# Patient Record
Sex: Male | Born: 1945 | Race: Black or African American | Hispanic: No | Marital: Married | Smoking: Current every day smoker
Health system: Southern US, Community
[De-identification: ages and names within clinical notes are randomized; demographics above are authoritative.]

## PROBLEM LIST (undated history)

## (undated) DIAGNOSIS — E119 Type 2 diabetes mellitus without complications: Secondary | ICD-10-CM

## (undated) DIAGNOSIS — I10 Essential (primary) hypertension: Secondary | ICD-10-CM

## (undated) DIAGNOSIS — L409 Psoriasis, unspecified: Secondary | ICD-10-CM

## (undated) HISTORY — PX: BACK SURGERY: SHX140

---

## 1997-12-12 ENCOUNTER — Emergency Department (HOSPITAL_COMMUNITY): Admission: EM | Admit: 1997-12-12 | Discharge: 1997-12-12 | Payer: Self-pay | Admitting: Emergency Medicine

## 2001-03-23 ENCOUNTER — Emergency Department (HOSPITAL_COMMUNITY): Admission: EM | Admit: 2001-03-23 | Discharge: 2001-03-23 | Payer: Self-pay | Admitting: Emergency Medicine

## 2001-03-23 ENCOUNTER — Encounter: Payer: Self-pay | Admitting: Emergency Medicine

## 2001-04-26 ENCOUNTER — Emergency Department (HOSPITAL_COMMUNITY): Admission: EM | Admit: 2001-04-26 | Discharge: 2001-04-26 | Payer: Self-pay | Admitting: Emergency Medicine

## 2001-05-13 ENCOUNTER — Emergency Department (HOSPITAL_COMMUNITY): Admission: EM | Admit: 2001-05-13 | Discharge: 2001-05-13 | Payer: Self-pay | Admitting: Emergency Medicine

## 2004-11-24 ENCOUNTER — Emergency Department (HOSPITAL_COMMUNITY): Admission: EM | Admit: 2004-11-24 | Discharge: 2004-11-24 | Payer: Self-pay | Admitting: Emergency Medicine

## 2004-12-15 ENCOUNTER — Emergency Department (HOSPITAL_COMMUNITY): Admission: EM | Admit: 2004-12-15 | Discharge: 2004-12-15 | Payer: Self-pay | Admitting: Emergency Medicine

## 2004-12-24 ENCOUNTER — Encounter: Admission: RE | Admit: 2004-12-24 | Discharge: 2004-12-24 | Payer: Self-pay | Admitting: Emergency Medicine

## 2005-04-29 ENCOUNTER — Inpatient Hospital Stay (HOSPITAL_COMMUNITY): Admission: AD | Admit: 2005-04-29 | Discharge: 2005-05-01 | Payer: Self-pay | Admitting: *Deleted

## 2005-06-15 ENCOUNTER — Ambulatory Visit: Payer: Self-pay | Admitting: Internal Medicine

## 2006-05-31 ENCOUNTER — Ambulatory Visit: Payer: Self-pay | Admitting: Internal Medicine

## 2006-08-24 ENCOUNTER — Encounter: Admission: RE | Admit: 2006-08-24 | Discharge: 2006-08-24 | Payer: Self-pay | Admitting: Urology

## 2006-08-26 ENCOUNTER — Ambulatory Visit (HOSPITAL_BASED_OUTPATIENT_CLINIC_OR_DEPARTMENT_OTHER): Admission: RE | Admit: 2006-08-26 | Discharge: 2006-08-26 | Payer: Self-pay | Admitting: Urology

## 2007-06-06 ENCOUNTER — Ambulatory Visit: Payer: Self-pay | Admitting: Internal Medicine

## 2007-06-06 DIAGNOSIS — M19079 Primary osteoarthritis, unspecified ankle and foot: Secondary | ICD-10-CM | POA: Insufficient documentation

## 2007-06-06 DIAGNOSIS — I10 Essential (primary) hypertension: Secondary | ICD-10-CM | POA: Insufficient documentation

## 2007-06-06 DIAGNOSIS — E119 Type 2 diabetes mellitus without complications: Secondary | ICD-10-CM

## 2007-06-06 DIAGNOSIS — E785 Hyperlipidemia, unspecified: Secondary | ICD-10-CM | POA: Insufficient documentation

## 2007-06-06 LAB — CONVERTED CEMR LAB
BUN: 9 mg/dL (ref 6–23)
Basophils Absolute: 0.3 10*3/uL — ABNORMAL HIGH (ref 0.0–0.1)
Basophils Relative: 4.3 % — ABNORMAL HIGH (ref 0.0–1.0)
Bilirubin Urine: NEGATIVE
CO2: 24 meq/L (ref 19–32)
Chloride: 100 meq/L (ref 96–112)
Creatinine, Ser: 0.8 mg/dL (ref 0.4–1.5)
Eosinophils Relative: 0.6 % (ref 0.0–5.0)
Glucose, Bld: 128 mg/dL — ABNORMAL HIGH (ref 70–99)
HCT: 43 % (ref 39.0–52.0)
Hemoglobin: 14.7 g/dL (ref 13.0–17.0)
Ketones, ur: NEGATIVE mg/dL
Lymphocytes Relative: 41.4 % (ref 12.0–46.0)
MCV: 91.1 fL (ref 78.0–100.0)
Monocytes Absolute: 0.3 10*3/uL (ref 0.2–0.7)
Monocytes Relative: 4.3 % (ref 3.0–11.0)
Neutro Abs: 3.3 10*3/uL (ref 1.4–7.7)
Nitrite: NEGATIVE
Platelets: 305 10*3/uL (ref 150–400)
RBC: 4.72 M/uL (ref 4.22–5.81)
Sodium: 135 meq/L (ref 135–145)
Specific Gravity, Urine: 1.015 (ref 1.000–1.03)
Total Bilirubin: 0.7 mg/dL (ref 0.3–1.2)
Total CHOL/HDL Ratio: 3
Triglycerides: 96 mg/dL (ref 0–149)
Urine Glucose: NEGATIVE mg/dL
VLDL: 19 mg/dL (ref 0–40)
WBC: 6.7 10*3/uL (ref 4.5–10.5)
pH: 6 (ref 5.0–8.0)

## 2008-05-18 ENCOUNTER — Ambulatory Visit: Payer: Self-pay | Admitting: Internal Medicine

## 2008-05-18 DIAGNOSIS — M79609 Pain in unspecified limb: Secondary | ICD-10-CM | POA: Insufficient documentation

## 2008-05-18 DIAGNOSIS — M25569 Pain in unspecified knee: Secondary | ICD-10-CM | POA: Insufficient documentation

## 2008-05-22 ENCOUNTER — Encounter (INDEPENDENT_AMBULATORY_CARE_PROVIDER_SITE_OTHER): Payer: Self-pay | Admitting: *Deleted

## 2008-09-06 ENCOUNTER — Encounter: Payer: Self-pay | Admitting: Internal Medicine

## 2009-05-27 ENCOUNTER — Ambulatory Visit: Payer: Self-pay | Admitting: Internal Medicine

## 2009-05-27 LAB — CONVERTED CEMR LAB
AST: 19 units/L (ref 0–37)
Albumin: 4 g/dL (ref 3.5–5.2)
Alkaline Phosphatase: 66 units/L (ref 39–117)
BUN: 8 mg/dL (ref 6–23)
Basophils Relative: 0.6 % (ref 0.0–3.0)
Bilirubin, Direct: 0.1 mg/dL (ref 0.0–0.3)
CO2: 27 meq/L (ref 19–32)
Chloride: 102 meq/L (ref 96–112)
Direct LDL: 122.7 mg/dL
Eosinophils Relative: 0.5 % (ref 0.0–5.0)
HCT: 42.2 % (ref 39.0–52.0)
HDL: 52.5 mg/dL (ref >39.00)
Hgb A1c MFr Bld: 6.3 % (ref 4.6–6.5)
Lymphocytes Relative: 32.7 % (ref 12.0–46.0)
MCHC: 34.6 g/dL (ref 30.0–36.0)
Neutro Abs: 3 10*3/uL (ref 1.4–7.7)
Neutrophils Relative %: 61.7 % (ref 43.0–77.0)
PSA: 1.26 ng/mL (ref 0.10–4.00)
RBC: 4.4 M/uL (ref 4.22–5.81)
RDW: 13.2 % (ref 11.5–14.6)
TSH: 0.78 microintl units/mL (ref 0.35–5.50)
Total Protein, Urine: NEGATIVE mg/dL
Total Protein: 8.1 g/dL (ref 6.0–8.3)
Triglycerides: 229 mg/dL — ABNORMAL HIGH (ref 0.0–149.0)
Urine Glucose: NEGATIVE mg/dL
WBC: 4.7 10*3/uL (ref 4.5–10.5)
pH: 5 (ref 5.0–8.0)

## 2009-06-20 ENCOUNTER — Encounter (INDEPENDENT_AMBULATORY_CARE_PROVIDER_SITE_OTHER): Payer: Self-pay | Admitting: *Deleted

## 2009-07-30 ENCOUNTER — Encounter (INDEPENDENT_AMBULATORY_CARE_PROVIDER_SITE_OTHER): Payer: Self-pay

## 2009-09-02 ENCOUNTER — Encounter: Payer: Self-pay | Admitting: Internal Medicine

## 2009-11-19 ENCOUNTER — Ambulatory Visit: Payer: Self-pay | Admitting: Internal Medicine

## 2009-11-19 DIAGNOSIS — G4710 Hypersomnia, unspecified: Secondary | ICD-10-CM | POA: Insufficient documentation

## 2009-11-19 DIAGNOSIS — G4700 Insomnia, unspecified: Secondary | ICD-10-CM | POA: Insufficient documentation

## 2009-11-19 DIAGNOSIS — R609 Edema, unspecified: Secondary | ICD-10-CM | POA: Insufficient documentation

## 2009-11-19 LAB — CONVERTED CEMR LAB
BUN: 12 mg/dL (ref 6–23)
CO2: 26 meq/L (ref 19–32)
Chloride: 105 meq/L (ref 96–112)
Cholesterol: 201 mg/dL — ABNORMAL HIGH (ref 0–200)
GFR calc non Af Amer: 126.22 mL/min (ref >60)
HDL: 56.9 mg/dL (ref >39.00)
Hgb A1c MFr Bld: 5.9 % (ref 4.6–6.5)
Potassium: 3.8 meq/L (ref 3.5–5.1)
Total CHOL/HDL Ratio: 4
Triglycerides: 166 mg/dL — ABNORMAL HIGH (ref 0.0–149.0)
VLDL: 33.2 mg/dL (ref 0.0–40.0)

## 2009-11-26 ENCOUNTER — Encounter: Payer: Self-pay | Admitting: Internal Medicine

## 2009-11-26 ENCOUNTER — Ambulatory Visit (HOSPITAL_BASED_OUTPATIENT_CLINIC_OR_DEPARTMENT_OTHER): Admission: RE | Admit: 2009-11-26 | Discharge: 2009-11-26 | Payer: Self-pay | Admitting: Internal Medicine

## 2009-11-29 ENCOUNTER — Telehealth: Payer: Self-pay | Admitting: Internal Medicine

## 2009-12-01 ENCOUNTER — Ambulatory Visit: Payer: Self-pay | Admitting: Pulmonary Disease

## 2009-12-06 ENCOUNTER — Telehealth: Payer: Self-pay | Admitting: Internal Medicine

## 2009-12-06 DIAGNOSIS — G4733 Obstructive sleep apnea (adult) (pediatric): Secondary | ICD-10-CM | POA: Insufficient documentation

## 2009-12-26 ENCOUNTER — Ambulatory Visit: Payer: Self-pay | Admitting: Pulmonary Disease

## 2010-01-13 ENCOUNTER — Telehealth (INDEPENDENT_AMBULATORY_CARE_PROVIDER_SITE_OTHER): Payer: Self-pay | Admitting: *Deleted

## 2010-01-22 ENCOUNTER — Encounter: Payer: Self-pay | Admitting: Pulmonary Disease

## 2010-01-24 ENCOUNTER — Ambulatory Visit: Payer: Self-pay | Admitting: Pulmonary Disease

## 2010-01-28 ENCOUNTER — Telehealth (INDEPENDENT_AMBULATORY_CARE_PROVIDER_SITE_OTHER): Payer: Self-pay | Admitting: *Deleted

## 2010-02-25 ENCOUNTER — Telehealth (INDEPENDENT_AMBULATORY_CARE_PROVIDER_SITE_OTHER): Payer: Self-pay | Admitting: *Deleted

## 2010-04-10 ENCOUNTER — Encounter: Admission: RE | Admit: 2010-04-10 | Discharge: 2010-04-10 | Payer: Self-pay | Admitting: Orthopedic Surgery

## 2010-05-20 ENCOUNTER — Ambulatory Visit: Payer: Self-pay | Admitting: Internal Medicine

## 2010-06-16 ENCOUNTER — Emergency Department (HOSPITAL_COMMUNITY): Admission: EM | Admit: 2010-06-16 | Discharge: 2010-06-16 | Payer: Self-pay | Admitting: Emergency Medicine

## 2010-06-26 ENCOUNTER — Ambulatory Visit: Payer: Self-pay | Admitting: Internal Medicine

## 2010-06-26 DIAGNOSIS — M5136 Other intervertebral disc degeneration, lumbar region: Secondary | ICD-10-CM

## 2010-06-27 LAB — CONVERTED CEMR LAB
Chloride: 104 meq/L (ref 96–112)
Creatinine, Ser: 0.5 mg/dL (ref 0.4–1.5)
GFR calc non Af Amer: 221.79 mL/min (ref >60.00)
HDL: 60.1 mg/dL (ref >39.00)
PSA: 1.06 ng/mL (ref 0.10–4.00)
Potassium: 3.5 meq/L (ref 3.5–5.1)
Triglycerides: 109 mg/dL (ref 0.0–149.0)

## 2010-07-09 ENCOUNTER — Telehealth (INDEPENDENT_AMBULATORY_CARE_PROVIDER_SITE_OTHER): Payer: Self-pay | Admitting: *Deleted

## 2010-07-10 ENCOUNTER — Telehealth (INDEPENDENT_AMBULATORY_CARE_PROVIDER_SITE_OTHER): Payer: Self-pay | Admitting: Radiology

## 2010-07-10 ENCOUNTER — Encounter (HOSPITAL_COMMUNITY): Admission: RE | Admit: 2010-07-10 | Payer: Self-pay | Source: Home / Self Care | Admitting: Internal Medicine

## 2010-07-10 ENCOUNTER — Ambulatory Visit: Payer: Self-pay

## 2010-07-10 ENCOUNTER — Encounter: Payer: Self-pay | Admitting: Internal Medicine

## 2010-07-10 ENCOUNTER — Inpatient Hospital Stay (HOSPITAL_COMMUNITY)
Admission: EM | Admit: 2010-07-10 | Discharge: 2010-07-13 | Payer: Self-pay | Source: Home / Self Care | Attending: Internal Medicine | Admitting: Internal Medicine

## 2010-07-15 ENCOUNTER — Telehealth: Payer: Self-pay | Admitting: Internal Medicine

## 2010-07-17 ENCOUNTER — Encounter: Payer: Self-pay | Admitting: Internal Medicine

## 2010-07-25 ENCOUNTER — Encounter: Payer: Self-pay | Admitting: Internal Medicine

## 2010-07-28 ENCOUNTER — Ambulatory Visit: Admit: 2010-07-28 | Payer: Self-pay | Admitting: Internal Medicine

## 2010-08-05 ENCOUNTER — Encounter (HOSPITAL_COMMUNITY): Admission: RE | Admit: 2010-08-05 | Payer: Self-pay | Source: Home / Self Care | Admitting: Internal Medicine

## 2010-08-11 ENCOUNTER — Telehealth (INDEPENDENT_AMBULATORY_CARE_PROVIDER_SITE_OTHER): Payer: Self-pay

## 2010-08-12 ENCOUNTER — Encounter: Payer: Self-pay | Admitting: Cardiology

## 2010-08-12 ENCOUNTER — Ambulatory Visit: Admission: RE | Admit: 2010-08-12 | Discharge: 2010-08-12 | Payer: Self-pay | Source: Home / Self Care

## 2010-08-12 ENCOUNTER — Encounter: Payer: Self-pay | Admitting: *Deleted

## 2010-08-12 ENCOUNTER — Encounter (HOSPITAL_COMMUNITY)
Admission: RE | Admit: 2010-08-12 | Discharge: 2010-08-19 | Payer: Self-pay | Source: Home / Self Care | Attending: Internal Medicine | Admitting: Internal Medicine

## 2010-08-13 ENCOUNTER — Telehealth (INDEPENDENT_AMBULATORY_CARE_PROVIDER_SITE_OTHER): Payer: Self-pay | Admitting: *Deleted

## 2010-08-13 ENCOUNTER — Ambulatory Visit: Admission: RE | Admit: 2010-08-13 | Discharge: 2010-08-13 | Payer: Self-pay | Source: Home / Self Care

## 2010-08-17 LAB — CONVERTED CEMR LAB
ALT: 18 units/L (ref 0–53)
Alkaline Phosphatase: 65 units/L (ref 39–117)
Basophils Relative: 0.7 % (ref 0.0–3.0)
Bilirubin Urine: NEGATIVE
CO2: 28 meq/L (ref 19–32)
Creatinine, Ser: 0.7 mg/dL (ref 0.4–1.5)
Eosinophils Absolute: 0.1 10*3/uL (ref 0.0–0.7)
Eosinophils Relative: 0.9 % (ref 0.0–5.0)
Glucose, Bld: 128 mg/dL — ABNORMAL HIGH (ref 70–99)
HCT: 39.2 % (ref 39.0–52.0)
HDL: 56.1 mg/dL (ref >39.0)
Hemoglobin, Urine: NEGATIVE
Hgb A1c MFr Bld: 7.5 % — ABNORMAL HIGH (ref 4.6–6.0)
Ketones, ur: NEGATIVE mg/dL
Lymphocytes Relative: 32.5 % (ref 12.0–46.0)
MCHC: 34.8 g/dL (ref 30.0–36.0)
MCV: 91.8 fL (ref 78.0–100.0)
Microalb Creat Ratio: 5.6 mg/g (ref 0.0–30.0)
Microalb, Ur: 0.4 mg/dL (ref 0.0–1.9)
Monocytes Relative: 8.1 % (ref 3.0–12.0)
Neutro Abs: 3.7 10*3/uL (ref 1.4–7.7)
Neutrophils Relative %: 57.8 % (ref 43.0–77.0)
Nitrite: NEGATIVE
Potassium: 4.1 meq/L (ref 3.5–5.1)
RBC: 4.27 M/uL (ref 4.22–5.81)
RDW: 13.7 % (ref 11.5–14.6)
Sodium: 140 meq/L (ref 135–145)
Specific Gravity, Urine: 1.015 (ref 1.000–1.03)
TSH: 0.77 microintl units/mL (ref 0.35–5.50)
Total Bilirubin: 0.7 mg/dL (ref 0.3–1.2)
Total Protein, Urine: NEGATIVE mg/dL
Urobilinogen, UA: 0.2 (ref 0.0–1.0)
WBC: 6.3 10*3/uL (ref 4.5–10.5)

## 2010-08-19 NOTE — Progress Notes (Signed)
  Phone Note Other Incoming   Request: Send information Action Taken: Software engineer of Call: Request for records received from [employer]. Request forwarded to [facility].

## 2010-08-19 NOTE — Assessment & Plan Note (Signed)
Summary: PER ROBIN--SURGERY CLEARANCE---STC   Vital Signs:  Patient profile:   65 year old male Height:      71 inches Weight:      283 pounds BMI:     39.61 O2 Sat:      98 % on Room air Temp:     98.6 degrees F oral Pulse rate:   94 / minute BP sitting:   132 / 82  (left arm) Cuff size:   large  Vitals Entered By: Zella Ball Ewing CMA Duncan Dull) (June 26, 2010 3:00 PM)  O2 Flow:  Room air  CC: Surgical Clearance/RE   Primary Care Provider:  Corwin Levins MD  CC:  Surgical Clearance/RE.  History of Present Illness: here with wife who is CNA doing private duty for another client; not working since nov 4;  Pt denies CP, worsening sob, doe, wheezing, orthopnea, pnd, worsening LE edema, palps, dizziness or syncope  Pt denies new neuro symptoms such as headache, facial or extremity weakness  Pt denies polydipsia, polyuria, or low sugar symptoms such as shakiness improved with eating.  Overall good compliance with meds, trying to follow low chol, DM diet, wt stable, little excercise however Stopped the actos and lovastatin for unclear reasons.  Has pain to the lower back now about 8/10, chronic persistent wtihout bowel or bladder change, fever , due for surgury for 4 level lumber disc surgury.    Preventive Screening-Counseling & Management      Drug Use:  no.    Problems Prior to Update: 1)  Special Screening Malig Neoplasms Other Sites  (ICD-V76.49) 2)  Disc Disease, Lumbar  (ICD-722.52) 3)  Preoperative Examination  (ICD-V72.84) 4)  Sleep Apnea, Obstructive  (ICD-327.23) 5)  Insomnia-sleep Disorder-unspec  (ICD-780.52) 6)  Peripheral Edema  (ICD-782.3) 7)  Hypersomnia  (ICD-780.54) 8)  Leg Pain, Bilateral  (ICD-729.5) 9)  Knee Pain, Bilateral  (ICD-719.46) 10)  Preventive Health Care  (ICD-V70.0) 11)  Osteoarthrosis Unspec Whether Gen/loc Ank&foot  (ICD-715.97) 12)  Hypertension  (ICD-401.9) 13)  Hyperlipidemia  (ICD-272.4) 14)  Diabetes Mellitus, Type II  (ICD-250.00) 15)   Preventive Health Care  (ICD-V70.0) 16)  Family History Breast Cancer 1st Degree Relative <50  (ICD-V16.3) 17)  Family History of Cad Male 1st Degree Relative <50  (ICD-V17.3)  Medications Prior to Update: 1)  Actos 45 Mg Tabs (Pioglitazone Hcl) .... Take 1 Tablet By Mouth Once A Day 2)  Amlodipine Besylate 10 Mg Tabs (Amlodipine Besylate) .Marland Kitchen.. 1 Tablet By Mouth Once A Day 3)  Benazepril Hcl 20 Mg Tabs (Benazepril Hcl) .Marland Kitchen.. 1po Once Daily 4)  Glipizide 10 Mg Tb24 (Glipizide) .Marland Kitchen.. 1 Tablet By Mouth Once A Day 5)  Lovastatin 40 Mg Tabs (Lovastatin) .Marland Kitchen.. 1po Once Daily 6)  Ecotrin Low Strength 81 Mg  Tbec (Aspirin) .Marland Kitchen.. 1 By Mouth Qd 7)  Onetouch Ultrasoft Lancets   Misc (Lancets) .... Use Asd 1 Once Daily 8)  Onetouch Ultra Test   Strp (Glucose Blood) .... Use Asd 1 Once Daily 9)  Cialis 20 Mg Tabs (Tadalafil) .Marland Kitchen.. 1 By Mouth Every Other Day As Needed 10)  Diclofenac Sodium 75 Mg Tbec (Diclofenac Sodium) .Marland Kitchen.. 1 By Mouth Two Times A Day As Needed 11)  Zolpidem Tartrate 5 Mg Tabs (Zolpidem Tartrate) .Marland Kitchen.. 1 By Mouth Once Daily As Needed Sleep  Current Medications (verified): 1)  Amlodipine Besylate 10 Mg Tabs (Amlodipine Besylate) .Marland Kitchen.. 1 Tablet By Mouth Once A Day 2)  Benazepril Hcl 20 Mg Tabs (Benazepril Hcl) .Marland KitchenMarland KitchenMarland Kitchen  1po Once Daily 3)  Glipizide 10 Mg Tb24 (Glipizide) .Marland Kitchen.. 1 Tablet By Mouth Once A Day 4)  Ecotrin Low Strength 81 Mg  Tbec (Aspirin) .Marland Kitchen.. 1 By Mouth Qd 5)  Onetouch Ultrasoft Lancets   Misc (Lancets) .... Use Asd 1 Once Daily 6)  Onetouch Ultra Test   Strp (Glucose Blood) .... Use Asd 1 Once Daily 7)  Cialis 20 Mg Tabs (Tadalafil) .Marland Kitchen.. 1 By Mouth Every Other Day As Needed 8)  Diclofenac Sodium 75 Mg Tbec (Diclofenac Sodium) .Marland Kitchen.. 1 By Mouth Two Times A Day As Needed 9)  Zolpidem Tartrate 5 Mg Tabs (Zolpidem Tartrate) .Marland Kitchen.. 1 By Mouth Once Daily As Needed Sleep 10)  Oxycodone-Acetaminophen 7.5-325 Mg Tabs (Oxycodone-Acetaminophen) .Marland Kitchen.. 1 By Mouth Q 6 Hrs As Needed  Allergies  (verified): 1)  ! Celebrex 2)  ! Sulfa 3)  ! Penicillin 4)  Simvastatin  Past History:  Past Medical History: Last updated: 05/18/2008 Diabetes mellitus, type II Hyperlipidemia Hypertension right ankle djd knee djd E.D.  Past Surgical History: Last updated: 12/26/2009 march 2008 - circumcision R hand surgery  Family History: Last updated: 05/18/2008 Family History of CAD Male  - father died with MI at 86 yo Family History Breast cancer - grandmother  Social History: Last updated: 06/26/2010 Current Smoker. smokes cigars approx 2- 3 per day. 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 smokes cigars approx 2- 3 per day. 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   Head:  normocephalic and atraumatic.   Eyes:  vision grossly intact, pupils equal, and pupils round.   Ears:  R ear normal and L ear normal.   Nose:  no external deformity and no nasal discharge.   Mouth:  no gingival abnormalities and pharynx pink and moist.   Neck:  supple and no masses.   Lungs:  normal respiratory effort and normal breath sounds.   Heart:  normal rate and regular rhythm.   Abdomen:  soft, non-tender, and normal bowel sounds.   Msk:  no joint tenderness and no joint swelling.  , has diffuse lumbar spinal and paravertebral tenderness without swelling Extremities:  no edema, no erythema  Neurologic:  cranial nerves II-XII intact and strength normal in all extremities.  , sitting in wheelchair today   Impression & Recommendations:  Problem # 1:  DISC DISEASE, LUMBAR (ICD-722.52) severe, for 4 level laminectomy asap, for tx with  percocet 7.5/325 as needed for now for  better pain control  Problem # 2:  DIABETES MELLITUS, TYPE II (ICD-250.00)  The following medications were removed from the medication list:    Actos 45 Mg Tabs (Pioglitazone hcl) .Marland Kitchen... Take 1 tablet by mouth once a day His updated medication list for this problem includes:    Benazepril Hcl 20 Mg Tabs (Benazepril hcl) .Marland Kitchen... 1po once daily    Glipizide 10 Mg Tb24 (Glipizide) .Marland Kitchen... 1 tablet by mouth once a day    Ecotrin Low Strength 81 Mg Tbec (Aspirin) .Marland Kitchen... 1 by mouth qd  Labs Reviewed: Creat: 0.8 (11/19/2009)    Reviewed HgBA1c results: 5.9 (11/19/2009)  6.3 (05/27/2009)  Orders: TLB-A1C /  Hgb A1C (Glycohemoglobin) (83036-A1C) TLB-BMP (Basic Metabolic Panel-BMET) (80048-METABOL) TLB-Lipid Panel (80061-LIPID) stopped the actos  and now  ? control - for Pt to cont DM diet, excercise, wt control efforts; to check labs today   Problem # 3:  HYPERTENSION (ICD-401.9)  His updated medication list for this problem includes:    Amlodipine Besylate 10 Mg Tabs (Amlodipine besylate) .Marland Kitchen... 1 tablet by mouth once a day    Benazepril Hcl 20 Mg Tabs (Benazepril hcl) .Marland Kitchen... 1po once daily  BP today: 132/82 Prior BP: 110/68 (01/24/2010)  Labs Reviewed: K+: 3.8 (11/19/2009) Creat: : 0.8 (11/19/2009)   Chol: 201 (11/19/2009)   HDL: 56.90 (11/19/2009)   LDL: 94 (05/18/2008)   TG: 166.0 (11/19/2009) stable overall by hx and exam, ok to continue meds/tx as is   Problem # 4:  PREOPERATIVE EXAMINATION (ICD-V72.84) exam overall stable but with mult CRF's at at least mod risk for CV surgury complication - for stress test Orders: Cardiolite (Cardiolite)  Complete Medication List: 1)  Amlodipine Besylate 10 Mg Tabs (Amlodipine besylate) .Marland Kitchen.. 1 tablet by mouth once a day 2)  Benazepril Hcl 20 Mg Tabs (Benazepril hcl) .Marland Kitchen.. 1po once daily 3)  Glipizide 10 Mg Tb24 (Glipizide) .Marland Kitchen.. 1 tablet by mouth once a day 4)  Ecotrin Low Strength 81 Mg Tbec (Aspirin) .Marland Kitchen.. 1  by mouth qd 5)  Onetouch Ultrasoft Lancets Misc (Lancets) .... Use asd 1 once daily 6)  Onetouch Ultra Test Strp (Glucose blood) .... Use asd 1 once daily 7)  Cialis 20 Mg Tabs (Tadalafil) .Marland Kitchen.. 1 by mouth every other day as needed 8)  Diclofenac Sodium 75 Mg Tbec (Diclofenac sodium) .Marland Kitchen.. 1 by mouth two times a day as needed 9)  Zolpidem Tartrate 5 Mg Tabs (Zolpidem tartrate) .Marland Kitchen.. 1 by mouth once daily as needed sleep 10)  Oxycodone-acetaminophen 7.5-325 Mg Tabs (Oxycodone-acetaminophen) .Marland Kitchen.. 1 by mouth q 6 hrs as needed  Other Orders: TLB-PSA (Prostate Specific Antigen) (84153-PSA)  Patient Instructions: 1)  Please take all new medications as prescribed  2)  Continue all previous medications as before this visit 3)  Please go to the Lab in the basement for your blood and/or urine tests today 4)  You will be contacted about the referral(s) to: Adenosine Stress test 5)  Please call the number on the [facility] Card for results of your testing  6)  Please schedule a follow-up appointment in 6 months with : 7)  BMP prior to visit, ICD-9: 250.02 8)  Lipid Panel prior to visit, ICD-9: 9)  HbgA1C prior to visit, ICD-9: Prescriptions: OXYCODONE-ACETAMINOPHEN 7.5-325 MG TABS (OXYCODONE-ACETAMINOPHEN) 1 by mouth q 6 hrs as needed  #60 x 0   Entered and Authorized by:   Corwin Levins MD   Signed by:   Corwin Levins MD on 06/26/2010   Method used:   Print then Give to Patient   RxID:   [id number]    Orders Added: 1)  Cardiolite [Cardiolite] 2)  TLB-A1C / Hgb A1C (Glycohemoglobin) [83036-A1C] 3)  TLB-BMP (Basic Metabolic Panel-BMET) [80048-METABOL] 4)  TLB-Lipid Panel [80061-LIPID] 5)  TLB-PSA (Prostate Specific Antigen) [84153-PSA] 6)  Est. Patient Level IV [14782]

## 2010-08-19 NOTE — Assessment & Plan Note (Signed)
Summary: per dr Seward Meth osa/mhh   Copy to:  Oliver Barre Primary Provider/Referring Provider:  Corwin Levins MD  CC:  Sleep Consult.  History of Present Illness: The pt comes in today for evaluation and management of severe osa.  He recently had a npsg which revealed severe osa with AHI 50/hr and desat to 78%.    Current Medications (verified): 1)  Actos 45 Mg Tabs (Pioglitazone Hcl) .... Take 1 Tablet By Mouth Once A Day 2)  Amlodipine Besylate 10 Mg Tabs (Amlodipine Besylate) .Marland Kitchen.. 1 Tablet By Mouth Once A Day 3)  Benazepril Hcl 20 Mg Tabs (Benazepril Hcl) .Marland Kitchen.. 1po Once Daily 4)  Glipizide 10 Mg Tb24 (Glipizide) .Marland Kitchen.. 1 Tablet By Mouth Once A Day 5)  Lovastatin 40 Mg Tabs (Lovastatin) .Marland Kitchen.. 1po Once Daily 6)  Ecotrin Low Strength 81 Mg  Tbec (Aspirin) .Marland Kitchen.. 1 By Mouth Qd 7)  Onetouch Ultrasoft Lancets   Misc (Lancets) .... Use Asd 1 Once Daily 8)  Onetouch Ultra Test   Strp (Glucose Blood) .... Use Asd 1 Once Daily 9)  Cialis 20 Mg Tabs (Tadalafil) .Marland Kitchen.. 1 By Mouth Every Other Day As Needed 10)  Diclofenac Sodium 75 Mg Tbec (Diclofenac Sodium) .Marland Kitchen.. 1 By Mouth Two Times A Day As Needed 11)  Zolpidem Tartrate 5 Mg Tabs (Zolpidem Tartrate) .Marland Kitchen.. 1 By Mouth Once Daily As Needed Sleep  Allergies: 1)  ! Celebrex 2)  ! Sulfa 3)  ! Penicillin 4)  Simvastatin  Past History:  Past Medical History: Reviewed history from 05/18/2008 and no changes required. Diabetes mellitus, type II Hyperlipidemia Hypertension right ankle djd knee djd E.D.  Past Surgical History: march 2008 - circumcision R hand surgery  Family History: Reviewed history from 05/18/2008 and no changes required. Family History of CAD Male  - father died with MI at 63 yo Family History Breast cancer - grandmother  Social History: Current Smoker. smokes cigars approx 2- 3 per day. 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  The patient denies shortness of breath with activity, shortness of breath at rest, productive cough, non-productive cough, coughing up blood, chest pain, irregular heartbeats, weight change, abdominal pain, difficulty swallowing, sore throat, headaches, nasal congestion/difficulty breathing through nose, sneezing, ear ache, anxiety, hand/feet swelling, rash, change in color of mucus, and fever.    Vital Signs:  Patient profile:   65 year old male Height:      71 inches Weight:      303 pounds BMI:     42.41 O2 Sat:      98 % on Room air Temp:     98.4 degrees F oral Pulse rate:   81 / minute BP sitting:   128 / 70  (left arm) Cuff size:   large  Vitals Entered By: Arman Filter LPN (December 27, 4399 1:40 PM)  O2 Flow:  Room air CC: Sleep Consult Comments Medications reviewed with patient Arman Filter LPN  December 27, 270 1:46 PM    Physical Exam  General:  morbidly obese male in nad Eyes:  PERRLA and EOMI.   Nose:  deviated septum to right with narrowing bilat. Mouth:  severe posterior pharyngeal narrowing secondary to soft tissue redundancy.  Elongation of soft palate and uvula. Neck:  no  jvd, tmg, LN Lungs:  clear to auscultation Heart:  rrr, no mrg Abdomen:  soft and nontender, bs+ Extremities:  2+ edema bilat, no cyanosis pulses intact distally. Neurologic:  alert and oriented,moves all 4.   Impression & Recommendations:  Problem # 1:  SLEEP APNEA, OBSTRUCTIVE (ICD-327.23)  Other Orders: DME Referral (DME) Consultation Level IV (16109)  Patient Instructions: 1)  will start on cpap for treatment of your sleep apnea.  Please call if having tolerance issues. 2)  work on weight loss 3)  followup with me in 4 weeks.  Appended Document: note completion to the above.     Copy to:  [place] Primary Provider/Referring Provider:  Corwin Levins MD   History of Present Illness: The pt comes in today for evaluation and management of severe osa.  He recently had a npsg which revealed severe osa with AHI 50/hr and desat to 78%.  He has been noted to have loud snoring and also pauses in his breathing during sleep.  He goes to bed btw 12-1am, and arises at 6-9am to start his day.  He is not rested upon arising.  His wife sees him doze all day watching tv, and is unable to stay awake for movies.  He does have sleepiness driving longer distances, or after a big meal.  His epworth score is 16 today, and he tells me that his weight is up about 30 pounds.   Allergies: 1)  ! Celebrex 2)  ! Sulfa 3)  ! Penicillin 4)  Simvastatin   Impression & Recommendations:  Problem # 1:  SLEEP APNEA, OBSTRUCTIVE (ICD-327.23) the pt has severe osa documented by npsg.  I have had a long discussion with the pt about sleep apnea, including its impact on QOL and CV health.  The only treatment options that offer him a legitimate chance of treatment success are cpap coupled with weight loss.  The pt is willing to give this a try.  Will start at a moderate pressure level to allow for desensitization, and optimize pressure for him once he is wearing on a fairly consistent basis.  Patient Instructions: 1)  see above in main body of note.

## 2010-08-19 NOTE — Progress Notes (Signed)
Summary: Sleep study results  Phone Note Call from Patient Call back at Home Phone [phone]   Caller: Patient Summary of Call: Pt is requesting result back from his Sleep study test. he states he contact sleep center they state report has been sent out to md. pt states he need results can't return back to work until he recieve results. (Per chart was scan in 12/02/09) Initial call taken by: Orlan Leavens,  Dec 06, 2009 4:20 PM  Follow-up for Phone Call        results are on the computer, but interpretation of interpreting MD (Dr Shelle Iron) was not included in the report  I have sent hard copy to Dr Shelle Iron to ask for interpretation Follow-up by: Corwin Levins MD,  Dec 06, 2009 8:24 PM  Additional Follow-up for Phone Call Additional follow up Details #1::        Rosanne Ashing, this was finalized and signed on 12/01/09, with the study being done on 11/26/09.  There must be some issue between DocUsign on line and the sleep center.  the final report is in [place] on the computer with my electronic signature.  Can access thru "transcription" in echart.  I am sorry for the delay.  Will check into why you didn't receive the finalized copy.  Would be happy to see pt for evaluation if needed based on his study. Additional Follow-up by: Barbaraann Share MD,  Dec 08, 2009 3:34 PM  New Problems: SLEEP APNEA, OBSTRUCTIVE (ICD-327.23)   Additional Follow-up for Phone Call Additional follow up Details #2::    thanks to dr clance - very helpful to clarify this issue  Dr Shelle Iron interpretion is of Severe OSA - pt will need to see Dr Shelle Iron asap, and I will not be able to certify going back to work at this time, based on the test resulf -  please inform pt Follow-up by: Corwin Levins MD,  Dec 09, 2009 12:43 PM  Additional Follow-up for Phone Call Additional follow up Details #3:: Details for Additional Follow-up Action Taken: pt informed of results and referral. Told to expect call from [facility] with appt info Additional  Follow-up by: Margaret Pyle, CMA,  Dec 09, 2009 12:57 PM  New Problems: SLEEP APNEA, OBSTRUCTIVE (ICD-327.23)

## 2010-08-19 NOTE — Letter (Signed)
Summary: [employer], [employer]   Imported By: Sherian Rein 01/30/2010 08:08:07  _____________________________________________________________________  External Attachment:    Type:   Image     Comment:   External Document

## 2010-08-19 NOTE — Progress Notes (Signed)
  Phone Note Other Incoming   Request: Send information Summary of Call: Request received from [facility] forwarded to [facility].

## 2010-08-19 NOTE — Assessment & Plan Note (Signed)
Summary: meds/#cd   Vital Signs:  Patient profile:   65 year old male Height:      71 inches Weight:      305.50 pounds BMI:     42.76 O2 Sat:      98 % on Room air Temp:     97.7 degrees F oral Pulse rate:   95 / minute BP sitting:   120 / 80  (left arm) Cuff size:   large  Vitals Entered ByZella Ball Ewing (Nov 19, 2009 1:26 PM)  O2 Flow:  Room air CC: Medication refills/RE   CC:  Medication refills/RE.  History of Present Illness: overall doing ok; except for mild worsening LE edema;  Pt denies CP, sob, doe, wheezing, orthopnea, pnd, palps, dizziness or syncope .  Pt denies new neuro symptoms such as headache, facial or extremity weakness   Pt denies polydipsia, polyuria, or low sugar symptoms such as shakiness improved with eating.  Overall good compliance with meds, trying to follow low chol, DM diet, wt stable, little excercise however Also mentions for work purposes he needs a sleep study sched'd by may 15 if possible.  Has ? mild daytime somnolence but not most days.  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    Problems Prior to Update: 1)  Insomnia-sleep Disorder-unspec  (ICD-780.52) 2)  Peripheral Edema  (ICD-782.3) 3)  Hypersomnia  (ICD-780.54) 4)  Leg Pain, Bilateral  (ICD-729.5) 5)  Knee Pain, Bilateral  (ICD-719.46) 6)  Preventive Health Care  (ICD-V70.0) 7)  Osteoarthrosis Unspec Whether Gen/loc Ank&foot  (ICD-715.97) 8)  Hypertension  (ICD-401.9) 9)  Hyperlipidemia  (ICD-272.4) 10)  Diabetes Mellitus, Type II  (ICD-250.00) 11)  Preventive Health Care  (ICD-V70.0) 12)  Family History Breast Cancer 1st Degree Relative <50  (ICD-V16.3) 13)  Family History of Cad Male 1st Degree Relative <50  (ICD-V17.3)  Medications Prior to Update: 1)  Actos 45 Mg Tabs (Pioglitazone Hcl) .... Take 1 Tablet By Mouth Once A Day 2)  Amlodipine Besylate 10 Mg Tabs  (Amlodipine Besylate) .Marland Kitchen.. 1 Tablet By Mouth Once A Day 3)  Benazepril-Hydrochlorothiazide 20-25 Mg Tabs (Benazepril-Hydrochlorothiazide) .... Take 1 Tablet By Mouth Once A Day 4)  Glipizide 10 Mg Tb24 (Glipizide) .Marland Kitchen.. 1 Tablet By Mouth Once A Day 5)  Lovastatin 40 Mg Tabs (Lovastatin) .Marland Kitchen.. 1po Once Daily 6)  Ecotrin Low Strength 81 Mg  Tbec (Aspirin) .Marland Kitchen.. 1 By Mouth Qd 7)  Onetouch Ultrasoft Lancets   Misc (Lancets) .... Use Asd 1 Once Daily 8)  Onetouch Ultra Test   Strp (Glucose Blood) .... Use Asd 1 Once Daily 9)  Cialis 20 Mg Tabs (Tadalafil) .Marland Kitchen.. 1 By Mouth Every Other Day As Needed 10)  Diclofenac Sodium 75 Mg Tbec (Diclofenac Sodium) .Marland Kitchen.. 1 By Mouth Two Times A Day As Needed 11)  Metformin Hcl 500 Mg Tabs (Metformin Hcl) .Marland Kitchen.. 1po Once Daily  Current Medications (verified): 1)  Actos 45 Mg Tabs (Pioglitazone Hcl) .... Take 1 Tablet By Mouth Once A Day 2)  Amlodipine Besylate 10 Mg Tabs (Amlodipine Besylate) .Marland Kitchen.. 1 Tablet By Mouth Once A Day 3)  Benazepril Hcl 20 Mg Tabs (Benazepril Hcl) .Marland Kitchen.. 1po Once Daily 4)  Glipizide 10 Mg Tb24 (Glipizide) .Marland Kitchen.. 1 Tablet By Mouth Once A Day 5)  Lovastatin 40 Mg Tabs (Lovastatin) .Marland Kitchen.. 1po Once Daily 6)  Ecotrin Low Strength 81  Mg  Tbec (Aspirin) .Marland Kitchen.. 1 By Mouth Qd 7)  Onetouch Ultrasoft Lancets   Misc (Lancets) .... Use Asd 1 Once Daily 8)  Onetouch Ultra Test   Strp (Glucose Blood) .... Use Asd 1 Once Daily 9)  Cialis 20 Mg Tabs (Tadalafil) .Marland Kitchen.. 1 By Mouth Every Other Day As Needed 10)  Diclofenac Sodium 75 Mg Tbec (Diclofenac Sodium) .Marland Kitchen.. 1 By Mouth Two Times A Day As Needed 11)  Metformin Hcl 500 Mg Tabs (Metformin Hcl) .Marland Kitchen.. 1po Once Daily 12)  Furosemide 40 Mg Tabs (Furosemide) .Marland Kitchen.. 1po Once Daily 13)  Zolpidem Tartrate 5 Mg Tabs (Zolpidem Tartrate) .Marland Kitchen.. 1 By Mouth Once Daily As Needed Sleep  Allergies (verified): 1)  ! Celebrex 2)  Simvastatin  Past History:  Past Medical History: Last updated: 05/18/2008 Diabetes mellitus, type  II Hyperlipidemia Hypertension right ankle djd knee djd E.D.  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   Head:  normocephalic and atraumatic.   Eyes:  vision grossly intact, pupils equal, and pupils round.   Ears:  R ear normal and L ear normal.   Nose:  no external deformity and no nasal discharge.   Mouth:  no gingival abnormalities and pharynx pink and moist.   Neck:  supple and no masses.   Lungs:  normal respiratory effort and normal breath sounds.   Heart:  normal rate and regular rhythm.   Extremities:  no edema, no erythema    Impression & Recommendations:  Problem # 1:  DIABETES MELLITUS, TYPE II (ICD-250.00)  His updated medication list for this problem includes:    Actos 45 Mg Tabs (Pioglitazone hcl) .Marland Kitchen... Take 1 tablet by mouth once a day    Benazepril Hcl 20 Mg Tabs (Benazepril hcl) .Marland Kitchen... 1po once daily    Glipizide 10 Mg Tb24 (Glipizide) .Marland Kitchen... 1 tablet by mouth once a day    Ecotrin Low Strength 81 Mg Tbec (Aspirin) .Marland Kitchen... 1 by mouth qd    Metformin Hcl 500 Mg Tabs (Metformin hcl) .Marland Kitchen... 1po once daily  Labs Reviewed: Creat: 0.8 (05/27/2009)    Reviewed HgBA1c results: 6.3 (05/27/2009)  7.5 (05/18/2008) stable overall by hx and exam, ok to continue meds/tx as is - Pt to cont DM diet, excercise, wt loss efforts; to check labs today   Orders: TLB-A1C / Hgb A1C (Glycohemoglobin) (83036-A1C) TLB-BMP (Basic Metabolic Panel-BMET) (80048-METABOL) TLB-Lipid Panel (80061-LIPID) stable overall by hx and exam, ok to continue meds/tx as is   Problem # 2:  HYPERTENSION (ICD-401.9)  His updated medication  list for this problem includes:    Amlodipine Besylate 10 Mg Tabs (Amlodipine besylate) .Marland Kitchen... 1 tablet by mouth once a day    Benazepril Hcl 20 Mg Tabs (Benazepril hcl) .Marland Kitchen... 1po once daily    Furosemide 40 Mg Tabs (Furosemide) .Marland Kitchen... 1po once daily  BP today: 120/80 Prior BP: 124/74 (05/27/2009)  Labs Reviewed: K+: 3.9 (05/27/2009) Creat: : 0.8 (05/27/2009)   Chol: 229 (05/27/2009)   HDL: 52.50 (05/27/2009)   LDL: 94 (05/18/2008)   TG: 229.0 (05/27/2009) stable overall by hx and exam, ok to continue meds/tx as is   Problem # 3:  HYPERLIPIDEMIA (ICD-272.4)  His updated medication list for this problem includes:    Lovastatin 40 Mg Tabs (Lovastatin) .Marland Kitchen... 1po once daily ok to start statin as it does not cause swelling  Labs Reviewed: SGOT: 19 (05/27/2009)   SGPT: 16 (05/27/2009)   HDL:52.50 (05/27/2009), 56.1 (05/18/2008)  LDL:94 (05/18/2008), 87 (06/06/2007)  Chol:229 (05/27/2009), 172 (05/18/2008)  Trig:229.0 (05/27/2009), 112 (05/18/2008)  Problem # 4:  HYPERSOMNIA (ICD-780.54)  to schedule sleep test - prior to may 15 per pt request  Orders: Misc. Referral (Misc. Ref)  Problem # 5:  PERIPHERAL EDEMA (ICD-782.3)  His updated medication list for this problem includes:    Furosemide 40 Mg Tabs (Furosemide) .Marland Kitchen... 1po once daily to d/c the hctz, and start the lasix 40 mg per day  Problem # 6:  INSOMNIA-SLEEP DISORDER-UNSPEC (ICD-780.52)  difficult shift work and sleep disruption - for [employer] as needed   His updated medication list for this problem includes:    Zolpidem Tartrate 5 Mg Tabs (Zolpidem tartrate) .Marland Kitchen... 1 by mouth once daily as needed sleep  Complete Medication List: 1)  Actos 45 Mg Tabs (Pioglitazone hcl) .... Take 1 tablet by mouth once a day 2)  Amlodipine Besylate 10 Mg Tabs (Amlodipine besylate) .Marland Kitchen.. 1 tablet by mouth once a day 3)  Benazepril Hcl 20 Mg Tabs (Benazepril hcl) .Marland Kitchen.. 1po once daily 4)  Glipizide 10 Mg Tb24 (Glipizide) .Marland Kitchen.. 1 tablet by mouth once  a day 5)  Lovastatin 40 Mg Tabs (Lovastatin) .Marland Kitchen.. 1po once daily 6)  Ecotrin Low Strength 81 Mg Tbec (Aspirin) .Marland Kitchen.. 1 by mouth qd 7)  Onetouch Ultrasoft Lancets Misc (Lancets) .... Use asd 1 once daily 8)  Onetouch Ultra Test Strp (Glucose blood) .... Use asd 1 once daily 9)  Cialis 20 Mg Tabs (Tadalafil) .Marland Kitchen.. 1 by mouth every other day as needed 10)  Diclofenac Sodium 75 Mg Tbec (Diclofenac sodium) .Marland Kitchen.. 1 by mouth two times a day as needed 11)  Metformin Hcl 500 Mg Tabs (Metformin hcl) .Marland Kitchen.. 1po once daily 12)  Furosemide 40 Mg Tabs (Furosemide) .Marland Kitchen.. 1po once daily 13)  Zolpidem Tartrate 5 Mg Tabs (Zolpidem tartrate) .Marland Kitchen.. 1 by mouth once daily as needed sleep  Patient Instructions: 1)  stop the benazepril HCT 2)  start the furosemide 40 mg per day (generic lasix) 3)  start the benazepril 20 mg (no HCT in it) 4)  start the lovastatin 40 mg as prescribed - 1 per day 5)  take the generic ambien for sleep as needed (zolpidem) 6)  Please go to the Lab in the basement for your blood and/or urine tests today 7)  You will be contacted about the referral(s) to: sleep study 8)  Please schedule a follow-up appointment in 6 months with CPX labs and: 9)  HbgA1C prior to visit, ICD-9: 250.02 10)  Urine Microalbumin prior to visit, ICD-9: Prescriptions: ZOLPIDEM TARTRATE 5 MG TABS (ZOLPIDEM TARTRATE) 1 by mouth once daily as needed sleep  #30 x 5   Entered and Authorized by:   Corwin Levins MD   Signed by:   Corwin Levins MD on 11/19/2009   Method used:   Print then Give to Patient   RxID:   [id number] FUROSEMIDE 40 MG TABS (FUROSEMIDE) 1po once daily  #90 x 3   Entered and Authorized by:   Corwin Levins MD   Signed by:   Corwin Levins MD on 11/19/2009   Method used:   Print then Give to Patient  RxID:   [id number] BENAZEPRIL HCL 20 MG TABS (BENAZEPRIL HCL) 1po once daily  #90 x 3   Entered and Authorized by:   Corwin Levins MD   Signed by:   Corwin Levins MD on 11/19/2009   Method used:    Print then Give to Patient   RxID:   [id number] LOVASTATIN 40 MG TABS (LOVASTATIN) 1po once daily  #90 x 3   Entered and Authorized by:   Corwin Levins MD   Signed by:   Corwin Levins MD on 11/19/2009   Method used:   Print then Give to Patient   RxID:   [id number]

## 2010-08-19 NOTE — Letter (Signed)
Summary: Out of Work [employer] of Work Note/[facility]   Imported By: Sherian Rein 12/31/2009 11:42:02  _____________________________________________________________________  External Attachment:    Type:   Image     Comment:   External Document

## 2010-08-19 NOTE — Progress Notes (Signed)
Summary: disability papers ready? LMTCB x1  Phone Note Call from Patient Call back at Home Phone [phone]   Caller: Patient Call For: clqnce Summary of Call: pt wants to know if he can pick up his disability papers yet- have they been signed? says he was to hear back from someone last fri. (pt sounds irritated).  Initial call taken by: Tivis Ringer, CNA,  January 13, 2010 11:01 AM  Follow-up for Phone Call        Per Aundra Millet, these forms were signed by [facility] on Friday and sent down to [employer] and [employer] was informed to contact pt on Friday so he could come and pick these forms up.   LMOMTCB to inform pt of above and have him call [employer] to check on the status of this. Gweneth Dimitri RN  January 13, 2010 11:26 AM  pt came in today looking for paperwork.  Sent him to MR for papers.  Spoke to Paul and confirmed she has those papers. Follow-up by: Eugene Gavia,  January 13, 2010 11:56 AM

## 2010-08-19 NOTE — Progress Notes (Signed)
Summary: RESULTS  Phone Note Call from Patient   Summary of Call: Patient wants to know if results of sleep center testing have been recieved by Dr Jonny Ruiz. He needs results in order to return to work.  Initial call taken by: Lamar Sprinkles, CMA,  Nov 29, 2009 12:36 PM  Follow-up for Phone Call        I have not seen results -  to dahlia to help withi this please Follow-up by: Corwin Levins MD,  Nov 29, 2009 12:47 PM  Additional Follow-up for Phone Call Additional follow up Details #1::        Per Sleep Center, resulrs are awaiting e-signiture from Dr. Shelle Iron and once it is done results will be mailed and faxed to [facility]. Pt informed Additional Follow-up by: Margaret Pyle, CMA,  Dec 02, 2009 8:59 AM

## 2010-08-19 NOTE — Assessment & Plan Note (Signed)
Summary: rov for osa   Copy to:  [facility] Primary Provider/Referring Provider:  Corwin Levins MD  CC:  4 wk follow up.  Pt states he is wearing cpap everynight for approx 5 hours.  Denies problems with mask and pressure. Marland Kitchen  History of Present Illness: the pt comes in today for f/u of his known severe osa.  He was started on cpap at the last visit at a moderate pressure level, and the pt has been wearing compliantly with no mask or pressure issues.  His download today shows 78% compliance greater than 4 hours a night, that he used everynight for an average daily use of 5.5 hours, and that his AHI is well controlled on this setting.  The pt feels that he is sleeping better, and that his daytime alertness remains normal.  He denies any sleepiness with driving.  Current Medications (verified): 1)  Actos 45 Mg Tabs (Pioglitazone Hcl) .... Take 1 Tablet By Mouth Once A Day 2)  Amlodipine Besylate 10 Mg Tabs (Amlodipine Besylate) .Marland Kitchen.. 1 Tablet By Mouth Once A Day 3)  Benazepril Hcl 20 Mg Tabs (Benazepril Hcl) .Marland Kitchen.. 1po Once Daily 4)  Glipizide 10 Mg Tb24 (Glipizide) .Marland Kitchen.. 1 Tablet By Mouth Once A Day 5)  Lovastatin 40 Mg Tabs (Lovastatin) .Marland Kitchen.. 1po Once Daily 6)  Ecotrin Low Strength 81 Mg  Tbec (Aspirin) .Marland Kitchen.. 1 By Mouth Qd 7)  Onetouch Ultrasoft Lancets   Misc (Lancets) .... Use Asd 1 Once Daily 8)  Onetouch Ultra Test   Strp (Glucose Blood) .... Use Asd 1 Once Daily 9)  Cialis 20 Mg Tabs (Tadalafil) .Marland Kitchen.. 1 By Mouth Every Other Day As Needed 10)  Diclofenac Sodium 75 Mg Tbec (Diclofenac Sodium) .Marland Kitchen.. 1 By Mouth Two Times A Day As Needed 11)  Zolpidem Tartrate 5 Mg Tabs (Zolpidem Tartrate) .Marland Kitchen.. 1 By Mouth Once Daily As Needed Sleep  Allergies (verified): 1)  ! Celebrex 2)  ! Sulfa 3)  ! Penicillin 4)  Simvastatin  Past History:  Past medical, surgical, family and social histories (including risk factors) reviewed, and no changes noted (except as noted below).  Past Medical History: Reviewed  history from 05/18/2008 and no changes required. Diabetes mellitus, type II Hyperlipidemia Hypertension right ankle djd knee djd E.D.  Past Surgical History: Reviewed history from 12/26/2009 and no changes required. march 2008 - circumcision R hand surgery  Family History: Reviewed history from 05/18/2008 and no changes required. Family History of CAD Male  - father died with MI at 12 yo Family History Breast cancer - grandmother  Social History: Reviewed history from 12/26/2009 and no changes required. Current Smoker. smokes cigars approx 2- 3 per day. 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  The patient denies shortness of breath with activity, shortness of breath at rest, productive cough, coughing up blood, chest pain, irregular heartbeats, acid heartburn, indigestion, loss of appetite, weight change, abdominal pain, difficulty swallowing, sore throat, tooth/dental problems, headaches, sneezing, itching, ear ache, anxiety, depression, hand/feet swelling, joint stiffness or pain, rash, change in color of mucus, and fever.    Vital Signs:  Patient profile:   65 year old male Height:      69 inches Weight:      305 pounds BMI:     45.20 O2 Sat:      96 % on Room air  Temp:     98.3 degrees F oral Pulse rate:   91 / minute BP sitting:   110 / 68  (right arm) Cuff size:   large  Vitals Entered By: Gweneth Dimitri RN (January 24, 2010 11:36 AM)  O2 Flow:  Room air CC: 4 wk follow up.  Pt states he is wearing cpap everynight for approx 5 hours.  Denies problems with mask and pressure.  Comments Medications reviewed with patient Daytime contact number verified with patient. Gweneth Dimitri RN  January 24, 2010 11:37 AM    Physical Exam  General:  obese male in nad Nose:  no skin breakdown or pressure necrosis from cpap mask.  Lungs:  clear  Heart:   rrr Extremities:  mild ankle edema, no cyanosis  Neurologic:  alert, does not appear sleepy, moves all 4.   Impression & Recommendations:  Problem # 1:  SLEEP APNEA, OBSTRUCTIVE (ICD-327.23) the pt is wearing cpap compliantly, and is having no issues with mask fit or presssure.  He feels that he sleeps well with the device, and has no daytime alertness issues.  At this point, will need to optimize pressure on auto mode, although his AHI on the download appears to be well controlled.  I have also encouraged him to work aggressively on weight loss. Care Plan:  At this point, will arrange for the patient's machine to be changed over to auto mode for 2 weeks to optimize their pressure.  I will review the downloaded data once sent by dme, and also evaluate for compliance, leaks, and residual osa.  I will call the patient and dme to discuss the results, and have the patient's machine set appropriately.  This will serve as the pt's cpap pressure titration.  Other Orders: Est. Patient Level IV (09811) DME Referral (DME)  Patient Instructions: 1)  continue on cpap,and work on weight loss. 2)  will have your pressure optimized on auto mode for 2 weeks, and I will let you know your pressure. 3)  followup with me in 6mos, but call if having cpap tolerance issues.

## 2010-08-19 NOTE — Letter (Signed)
Summary: Pre Visit No Show Letter  [facility]  [address] [place], [place] [address]   Phone: [phone]  Fax: [phone]        July 30, 2009 MRN: [id number]    [facility] [address] [place], [place]  [address]    Dear Mr. Vivar,   We have been unable to reach you by phone concerning the pre-procedure visit that you missed on 08/18/202011. For this reason,your procedure scheduled on 08/13/2009  has been cancelled. Our scheduling staff will gladly assist you with rescheduling your appointments at a more convenient time. Please call our office at [phone] between the hours of 8:00am and 5:00pm, press option #2 to reach an appointment scheduler. Please consider updating your contact numbers at this time so that we can reach you by phone in the future with schedule changes or results.    Thank you,    Ulis Rias RN [facility]

## 2010-08-19 NOTE — Letter (Signed)
Summary: [facility]  [facility]   Imported By: Lester [place] 09/07/2009 08:50:05  _____________________________________________________________________  External Attachment:    Type:   Image     Comment:   External Document

## 2010-08-21 ENCOUNTER — Ambulatory Visit (HOSPITAL_COMMUNITY)
Admission: RE | Admit: 2010-08-21 | Discharge: 2010-08-21 | Disposition: A | Discharge status: Home / Self Care | Payer: 59 | Source: Ambulatory Visit | Attending: Specialist | Admitting: Specialist

## 2010-08-21 ENCOUNTER — Other Ambulatory Visit: Payer: Self-pay | Admitting: Internal Medicine

## 2010-08-21 ENCOUNTER — Encounter (HOSPITAL_COMMUNITY): Payer: 59

## 2010-08-21 ENCOUNTER — Other Ambulatory Visit (HOSPITAL_COMMUNITY): Payer: Self-pay | Admitting: Specialist

## 2010-08-21 DIAGNOSIS — Z01812 Encounter for preprocedural laboratory examination: Secondary | ICD-10-CM | POA: Insufficient documentation

## 2010-08-21 DIAGNOSIS — Z01810 Encounter for preprocedural cardiovascular examination: Secondary | ICD-10-CM | POA: Insufficient documentation

## 2010-08-21 DIAGNOSIS — M48061 Spinal stenosis, lumbar region without neurogenic claudication: Secondary | ICD-10-CM | POA: Insufficient documentation

## 2010-08-21 LAB — URINALYSIS, ROUTINE W REFLEX MICROSCOPIC
Hgb urine dipstick: NEGATIVE
Ketones, ur: 15 mg/dL — AB
Protein, ur: NEGATIVE mg/dL
Urine Glucose, Fasting: NEGATIVE mg/dL

## 2010-08-21 LAB — TYPE AND SCREEN

## 2010-08-21 LAB — ABO/RH: ABO/RH(D): B POS

## 2010-08-21 LAB — SURGICAL PCR SCREEN: MRSA, PCR: NEGATIVE

## 2010-08-21 NOTE — Progress Notes (Signed)
  Phone Note Call from Patient Call back at [facility] Phone [phone]   Caller: Patient Summary of Call: Pt called stating he had swelling of the legs and feet and wanted to be admitted to the hospital. I called pt back to inquire and advise probably ER /UC. Pt began by saying that this was Dr Raphael Gibney fault because he has never given him an EKG and when he had last OV he was given enogugh time to speak with MD. I apologized and advised pt that he was scheduled in a 15 min slot not 30 (explained the difference as well) and apologized to hime that he was not prevously advised to schedule a CPX. Pt then said that he was not going to ever come backa nd he just wants to be sent to Hospital. I advised pt that MD cannot admit unless he is here for an OV but he again became very angry stating that he thinks swelling is due to heart problems and he called cardiology and was told "if you can't get up on the chair by yourself we can't see you". I apologized to pt and again advised that since he is concerned about swelling her should got o ER. Pt then told me that if I did not come into the room and tell him to go have labs drawn at last OV he might have been able to explain to Dr Jonny Ruiz about swelling but if Dr Jonny Ruiz did not want to see him he should have just told him. Pt then hung up Initial call taken by: Margaret Pyle, CMA,  July 09, 2010 4:01 PM  Follow-up for Phone Call        EKG are normally done with the stress test, as well as preop with the anesthesiologist, so I thought it was redundant with the last visit  if he has swelling worsening, pt can be seen at any time;  swelling at last visit did not seem to be a major concern, compared to the back pain and evaluation for surgury risk Follow-up by: Corwin Levins MD,  July 09, 2010 4:06 PM  Additional Follow-up for Phone Call Additional follow up Details #1::        Spoke with pt and tried to advise above but pt was still very upset and adamant  that he did not have EKG because "I didn't see any cardiologist". I tried repeatedly to explain to pt but he eventually stated that he will no longer be coming back here and asked that I stop calling him. Additional Follow-up by: Margaret Pyle, CMA,  July 10, 2010 8:35 AM    Additional Follow-up for Phone Call Additional follow up Details #2::    ok to dismiss pt from practice; abusive behavior to staff Follow-up by: Corwin Levins MD,  July 10, 2010 12:35 PM  Additional Follow-up for Phone Call Additional follow up Details #3:: Details for Additional Follow-up Action Taken: Forms forwarded to [employer]. Margaret Pyle, CMA  July 10, 2010 4:59 PM

## 2010-08-21 NOTE — Assessment & Plan Note (Signed)
Summary: Cardiology Nuclear Testing  Nuclear Med Background Indications for Stress Test: Evaluation for Ischemia, Surgical Clearance  Indications Comments: Pending Central laminectomy by [facility].    Symptoms: DOE, Fatigue    Nuclear Pre-Procedure Cardiac Risk Factors: Family History - CAD, Hypertension, Lipids, NIDDM, Smoker Caffeine/Decaff Intake: none NPO After: 9:30 PM Lungs: clear IV 0.9% NS with Angio Cath: 22g     IV Site: R Wrist IV Started by: Cathlyn Parsons, RN Chest Size (in) 52     Height (in): 71 Weight (lb): 279 BMI: 39.05 Tech Comments: Patient held Glipizide and BS 155 @ 0800.  Nuclear Med Study 1 or 2 day study:  2 day     Stress Test Type:  Lexiscan Reading MD:  Cassell Clement, MD     Referring MD:  Dr.J Jonny Ruiz Resting Radionuclide:  Technetium 41m Tetrofosmin     Resting Radionuclide Dose:  33 mCi  Stress Radionuclide:  Technetium 9m Tetrofosmin     Stress Radionuclide Dose:  33 mCi   Stress Protocol Exercise Time (min):  0 min     Max HR:  122 bpm     Predicted Max HR:  158 bpm  Max Systolic BP: 142 mm Hg     Percent Max HR:  77.22 %     METS: 0 Rate Pressure Product:  [address]  Lexiscan: 0.4 mg   Stress Test Technologist:  Cathlyn Parsons, RN     Nuclear Technologist:  Doyne Keel, CNMT  Rest Procedure  Myocardial perfusion imaging was performed at rest 45 minutes following the intravenous administration of Technetium 88m Tetrofosmin.  Stress Procedure  The patient received IV Lexiscan 0.4 mg over 15-seconds.  Technetium 50m Tetrofosmin injected at 30-seconds.  There were no significant changes,no ectopy and no chest pain with infusion.  Quantitative spect images were obtained after a 45 minute delay.  QPS Raw Data Images:  Normal; no motion artifact; normal heart/lung ratio. Stress Images:  Normal homogeneous uptake in all areas of the myocardium. Rest Images:  Normal homogeneous uptake in all areas of the myocardium. Subtraction  (SDS):  No evidence of ischemia. Transient Ischemic Dilatation:  0.78  (Normal <1.22)  Lung/Heart Ratio:  0.31  (Normal <0.45)  Quantitative Gated Spect Images QGS EDV:  86 ml QGS ESV:  29 ml QGS EF:  67 %  Findings Normal nuclear study      Overall Impression  Exercise Capacity: Lexiscan with no exercise. BP Response: Normal blood pressure response. Clinical Symptoms: No chest pain ECG Impression: No significant ST segment change suggestive of ischemia. Overall Impression: Normal stress nuclear study.  Appended Document: Cardiology Nuclear Testing LMOPT - labs negative, normal, or stable  - No Acute problem

## 2010-08-21 NOTE — Progress Notes (Signed)
Summary: no show for stress test  Phone Note Outgoing Call   Call placed by: Harlow Asa CNMT Call placed to: Patient Reason for Call: Confirm/change Appt Summary of Call: Patient was called to confirm his appt. for nuclear stress test, but he wanted to speak to his PCP before proceeding with stress test.He was a no show for his exam on 07/10/10.

## 2010-08-21 NOTE — Progress Notes (Signed)
Summary: Rescheduled myoview to earlier appt.  per request Dr. Jonny Ruiz  Phone Note Outgoing Call Call back at [phone] Huntley Dec, Dr. Raphael Gibney office   Call placed by: Irean Hong, RN,  August 11, 2010 2:19 PM Summary of Call: Huntley Dec from Dr. Fayrene Fearing John's office called, ok with getting scheduled on 08/19/10  myoview study, but Dr. Jonny Ruiz would like sooner if possible. I called the patient, and resheduled the lexiscan myoview only for 08/12/10, and rest myoview for 08/13/10. I notified Lakesha at Dr. Raphael Gibney office of the earlier appts. Tripton Ned,RN.

## 2010-08-21 NOTE — Letter (Signed)
Summary: Declined/[facility]  Declined/[facility]   Imported By: Lester [place] 07/23/2010 08:28:30  _____________________________________________________________________  External Attachment:    Type:   Image     Comment:   External Document

## 2010-08-21 NOTE — Letter (Addendum)
Summary: Dismissal from the practice  [facility]  [address] [place], [place] [address]   Phone: [phone]  Fax: [phone]    07/10/2010  [facility] [address] [place], [place]  [address]  Dear Mr. Casey Gibson,       This letter is to notify you that unfortunately I find  it necessary to withdraw as your Primary Care Physician.  As  you have need for ongoing care, please contact the [facility]  Physician Referral Service if you need assistance with transfer  of your care to a new Primary Care practice.  We will be   available for emergency care for 30 days from the date of this  letter.  Please feel free to contact our Medical Records   Department for any and all records in order to assist in the   transfer of your care.       Sincerely,   Casey Barre MD  Appended Document: Letter mailed IDX and EMR updated to reflect patient dismissal. Letter sent out by certified/registered mail.  Appended Document: USPS receipt received Received USPS return receipt verifying delivery.

## 2010-08-21 NOTE — Progress Notes (Signed)
Summary: did not want an appt  Phone Note Call from Patient   Summary of Call: Someone called from the ER Friday to let us know Casey Gibson needed to make a follow-up appointment this week to re-check cellulitis.  I couldn't reach him Friday, but did reach him this afternoon.  He states he doesn't want to come until he is better because his leg hurts too much to get in and out of a car.   Initial call taken by: Hilarie Fredrickson,  July 15, 2010 1:29 PM  Follow-up for Phone Call        noted Follow-up by: Corwin Levins MD,  July 15, 2010 1:47 PM

## 2010-08-21 NOTE — Progress Notes (Signed)
Summary: Nuc. Pre-Procedure  Phone Note Outgoing Call Call back at [facility] Phone [phone]   Call placed by: Irean Hong, RN,  August 11, 2010 2:53 PM Summary of Call: Reviewed information on Myoview Information Sheet (see scanned document for further details).  Spoke with patient.     Nuclear Med Background Indications for Stress Test: Evaluation for Ischemia, Surgical Clearance  Indications Comments: Pending Central laminectomy by [facility].       Nuclear Pre-Procedure Cardiac Risk Factors: Family History - CAD, Hypertension, Lipids, NIDDM, Smoker Height (in): 71

## 2010-08-25 ENCOUNTER — Other Ambulatory Visit (HOSPITAL_COMMUNITY): Payer: Self-pay | Admitting: Specialist

## 2010-08-25 ENCOUNTER — Other Ambulatory Visit: Payer: Self-pay | Admitting: Internal Medicine

## 2010-08-25 ENCOUNTER — Inpatient Hospital Stay (HOSPITAL_COMMUNITY)
Admission: RE | Admit: 2010-08-25 | Discharge: 2010-08-28 | DRG: 491 | Disposition: A | Discharge status: Home / Self Care | Payer: 59 | Source: Ambulatory Visit | Attending: Specialist | Admitting: Specialist

## 2010-08-25 ENCOUNTER — Inpatient Hospital Stay (HOSPITAL_COMMUNITY): Payer: 59

## 2010-08-25 DIAGNOSIS — M48061 Spinal stenosis, lumbar region without neurogenic claudication: Principal | ICD-10-CM | POA: Diagnosis present

## 2010-08-25 DIAGNOSIS — Z01810 Encounter for preprocedural cardiovascular examination: Secondary | ICD-10-CM

## 2010-08-25 DIAGNOSIS — E119 Type 2 diabetes mellitus without complications: Secondary | ICD-10-CM | POA: Diagnosis present

## 2010-08-25 DIAGNOSIS — F411 Generalized anxiety disorder: Secondary | ICD-10-CM | POA: Diagnosis present

## 2010-08-25 DIAGNOSIS — Z01812 Encounter for preprocedural laboratory examination: Secondary | ICD-10-CM

## 2010-08-25 DIAGNOSIS — L408 Other psoriasis: Secondary | ICD-10-CM | POA: Diagnosis present

## 2010-08-25 DIAGNOSIS — I10 Essential (primary) hypertension: Secondary | ICD-10-CM | POA: Diagnosis present

## 2010-08-25 DIAGNOSIS — G4733 Obstructive sleep apnea (adult) (pediatric): Secondary | ICD-10-CM | POA: Diagnosis present

## 2010-08-25 DIAGNOSIS — E669 Obesity, unspecified: Secondary | ICD-10-CM | POA: Diagnosis present

## 2010-08-25 LAB — COMPREHENSIVE METABOLIC PANEL
ALT: 14 U/L (ref 0–53)
Albumin: 3.8 g/dL (ref 3.5–5.2)
CO2: 26 mEq/L (ref 19–32)
Chloride: 99 mEq/L (ref 96–112)
GFR calc non Af Amer: >60 mL/min (ref >60)
Glucose, Bld: 161 mg/dL — ABNORMAL HIGH (ref 70–99)
Total Protein: 7.6 g/dL (ref 6.0–8.3)

## 2010-08-25 LAB — GLUCOSE, CAPILLARY
Glucose-Capillary: 223 mg/dL — ABNORMAL HIGH (ref 70–99)
Glucose-Capillary: 362 mg/dL — ABNORMAL HIGH (ref 70–99)

## 2010-08-25 LAB — CBC
HCT: 38.7 % — ABNORMAL LOW (ref 39.0–52.0)
Hemoglobin: 13.3 g/dL (ref 13.0–17.0)
MCH: 31.6 pg (ref 26.0–34.0)
MCHC: 34.4 g/dL (ref 30.0–36.0)
MCV: 91.9 fL (ref 78.0–100.0)
WBC: 9 10*3/uL (ref 4.0–10.5)

## 2010-08-25 LAB — DIFFERENTIAL
Eosinophils Absolute: 0.1 10*3/uL (ref 0.0–0.7)
Lymphs Abs: 2.2 10*3/uL (ref 0.7–4.0)
Monocytes Absolute: 0.6 10*3/uL (ref 0.1–1.0)
Monocytes Relative: 7 % (ref 3–12)

## 2010-08-25 LAB — APTT: aPTT: 34 seconds (ref 24–37)

## 2010-08-25 LAB — PROTIME-INR: INR: 1.02 (ref 0.00–1.49)

## 2010-08-25 LAB — HEMOGLOBIN A1C
Hgb A1c MFr Bld: 7.2 % — ABNORMAL HIGH (ref <5.7)
Mean Plasma Glucose: 160 mg/dL — ABNORMAL HIGH (ref <117)

## 2010-08-26 LAB — BASIC METABOLIC PANEL
CO2: 25 mEq/L (ref 19–32)
Calcium: 8.6 mg/dL (ref 8.4–10.5)
Creatinine, Ser: 0.64 mg/dL (ref 0.4–1.5)
Glucose, Bld: 247 mg/dL — ABNORMAL HIGH (ref 70–99)
Potassium: 3.7 mEq/L (ref 3.5–5.1)
Sodium: 137 mEq/L (ref 135–145)

## 2010-08-26 LAB — GLUCOSE, CAPILLARY: Glucose-Capillary: 265 mg/dL — ABNORMAL HIGH (ref 70–99)

## 2010-08-27 LAB — HEMOGLOBIN AND HEMATOCRIT, BLOOD
HCT: 29 % — ABNORMAL LOW (ref 39.0–52.0)
Hemoglobin: 9.8 g/dL — ABNORMAL LOW (ref 13.0–17.0)

## 2010-08-27 LAB — BASIC METABOLIC PANEL
Calcium: 8.4 mg/dL (ref 8.4–10.5)
Creatinine, Ser: 0.57 mg/dL (ref 0.4–1.5)
GFR calc Af Amer: >60 mL/min (ref >60)
GFR calc non Af Amer: >60 mL/min (ref >60)
Sodium: 139 mEq/L (ref 135–145)

## 2010-08-27 LAB — GLUCOSE, CAPILLARY: Glucose-Capillary: 221 mg/dL — ABNORMAL HIGH (ref 70–99)

## 2010-08-27 NOTE — Letter (Signed)
Summary: Patient Dismissal Activation Form & USPS Receipt  Patient Dismissal Activation Form & USPS Receipt   Imported By: Lenard Forth 08/20/2010 17:37:47  _____________________________________________________________________  External Attachment:    Type:   Image     Comment:   External Document

## 2010-08-27 NOTE — Progress Notes (Signed)
Summary: Pulmonary will continue to treat  Phone Note Other Incoming   Caller: Lenard Forth Details for Reason: Question concerning dismissal from the practice. Summary of Call: This patient has been dismissed from Primary Care by Oliver Barre, MD effective 07/10/10. You have provided treatment in the last two years.   Do you agree with the dismissal or do you wish to continue to treat this patient? Thank you.  Follow-up for Phone Call        I do not have any reason at this time to dismiss him from my sleep practice.  He has been compliant with visits and therapy as far as I know. Follow-up by: Barbaraann Share MD,  August 13, 2010 4:59 PM

## 2010-08-28 LAB — GLUCOSE, CAPILLARY
Glucose-Capillary: 144 mg/dL — ABNORMAL HIGH (ref 70–99)
Glucose-Capillary: 237 mg/dL — ABNORMAL HIGH (ref 70–99)

## 2010-08-28 LAB — URINALYSIS, MICROSCOPIC ONLY
Bilirubin Urine: NEGATIVE
Hgb urine dipstick: NEGATIVE
Protein, ur: NEGATIVE mg/dL
Urine Glucose, Fasting: NEGATIVE mg/dL
Urobilinogen, UA: 0.2 mg/dL (ref 0.0–1.0)

## 2010-08-31 NOTE — Op Note (Signed)
Casey Gibson, Casey Gibson             ACCOUNT NO.:  [id number]  MEDICAL RECORD NO.:  [id number]           PATIENT TYPE:  I  LOCATION:  5015                         FACILITY:  [facility]  PHYSICIAN:  Kerrin Champagne, M.D.   DATE OF BIRTH:  1946-02-22  DATE OF PROCEDURE:  08/25/2010 DATE OF DISCHARGE:                              OPERATIVE REPORT   PREOPERATIVE DIAGNOSIS:  Lumbar spinal stenosis at L2-3, L3-4, L4-5 and L5-S1.  POSTOPERATIVE DIAGNOSIS:  Lumbar spinal stenosis at L2-3, L3-4, L4-5 and L5-S1 with moderate stenosis at L2-3 and L3-4 following lateral recesses and foramen at these segments, severe at L4-5 and at L5-S1 particularly involving bilateral L5 nerve roots and bilateral S1 nerve roots.  PROCEDURE:  Central decompressive laminectomy at L2-3, L3-4, L4-5 and L5- S1 with resection of the spinous process and central portions of the lamina of each of these segments, bilateral foraminotomies at the L2, L3, L4, L5 and S1 nerve roots. SURGEON:  Kerrin Champagne, MD  ASSISTANT:  Wende Neighbors, PA-C.  Note that the operating room microscope was used during this procedure.  ANESTHESIA:  General via orotracheal intubation, Dr. Sheldon Silvan, infiltration with Marcaine 0.5% with 1:200,000 epinephrine, total of 15 mL.  FINDINGS:  Severe lateral recess stenosis and foraminal stenosis at L5- S1 and L4-5 greater than L2-3 and L3-4.  SPECIMENS:  None.  ESTIMATED BLOOD LOSS:  300 mL, Cell Saver blood returned zero.  DRAINS:  Foley to straight drain.  Hemovac x1, right lower lumbar.  The patient returned to the PACU in good condition.  HISTORY OF PRESENT ILLNESS:  The patient is a 65 year old male who has been experiencing severe increasing neurogenic claudication since October of this year.  He is now using a wheelchair for ambulation purposes and a short distance ambulation of 15-25 feet most.  He was seen initially and evaluated after undergoing attempted conservative management  with epidural steroids.  MRI scan was done in September. Studies demonstrated stenosis at L3-4, L2-3, L4-5 and L5-S1 levels. Each segment with significant degenerative disk change and spondylotic changes.  The patient had attempts at epidural steroids; however, they did not seem to relieve the pain for any length of time.  He is taking narcotic medicines to relieve his discomfort and has pain with lying flat, asked to sit primarily to relieve his discomfort.  He underwent extensive preoperative evaluation beginning in early December, Cardiology evaluation indicating he was cleared for surgery.  We had discussion regarding the risks versus benefits of undergoing decompressive laminectomy in the office.  The patient understands the risks of infection, bleeding risks, and nerve injury.  He has signed informed consent.  DESCRIPTION OF PROCEDURE:  The patient was seen in the preoperative holding area, had marking of the lumbar area, expected L2-3, L3-4, L4-5 and L5-S1 central laminectomies to be done.  Standard preoperative antibiotics, he had a history of foot itching with penicillin, not felt to represent a significant allergy, so he was given 2 g of Ancef preoperatively in the preop holding area.  Transported to the OR via stretcher OR room #4 at [facility].  He underwent induction  of general anesthesia, a Foley catheter was placed following induction.  He was turned to a prone position on the [place] spine frame.  All pressure points were well padded.  The patient was readjusted twice and a pad placed at the lower edge of the chest roll as this was crease in his abdomen.  Additional padding was placed using yellow egg crates. However, the abdomen, shoulders and all pressure points were well padded.  PAS stockings were used.  He had a standard prep with DuraPrep solution from the mid-dorsal spine to the midsacral level.  He was draped in the usual manner iodine Vi-Drape.   Incision initially made extending from about the L1 level to the S2 level of the midline. Through skin and subcutaneous layers, the patient did have skin changes that represent plaque of either seborrheic keratosis.  Standard prep was carried out with this procedure and Vi-Drape used as noted.  Incision through skin and subcu layers directly down to the lumbodorsal fascia. The patient had at least 4 inches of subcu fat before reaching the lumbodorsal fascia.  This was incised in midline, carried over the spinous process of the expected L1, L2, L3, L4, L5 and S1 levels.  Calves were used to elevate the paralumbar muscles and electrocautery used to control bleeders.  Initially, cerebellar retractors were used and then a Viper for the subcu layers while another deeper fascia, paralumbar muscles were incised, again bleeders were controlled using electrocautery.  The Cell Saver was used during this case. Intraoperative radiograph obtained with clamp between the spinous process of L5 and S1 inferiorly and superiorly, a plane between L3 and L4 spinous process.  These clamps were then removed.  The ligamentum flavum incised where the clamp had been and this area was marked with the operating room marking pen.  The deeper Viper retractor was placed following dissection and exposure out to at least 50% of the facets on either side.  Large deeper Vipers were then inserted and excellent hemostasis obtained.  Leksell rongeur was used to remove the spinous process of L5, L4, L3 and L2 centrally.  The central portions of lamina were then thinned further using a double-action regular Leksell rongeur. Osteotomes were then used to perform osteotomy medial aspect of the facet bilaterally at L5-S1, removing 20-25% of medial facet on either side and at L4-5, L3-4, and L2-3.  A 3-mm Kerrison was used to resect the bone and the past the ligamentum flavum at each level.  The neural arch was also resected at  each level at L4-5, L3-4, and L2-3. Ligamentum flavum was then removed centrally using 3-mm Kerrisons.  This was carried down to the L5-S1 level.  The central laminectomy opening was then further enlarged again using osteotomes to resect the bone and greenstick.  The medial aspect of the facets at each side until an adequate resection had been obtained and the ligamentum flavum debrided at each segment.  Loupe magnification, a headlamp was used for the initial portions of the procedure.  Carefully, we retracting the thecal sac with the excision of bone bilaterally at L2-L3, L4-5 and L5-S1. Ligamentum flavum centrally at L5-S1 was resected, carried out into the neural foramen at both sides.  Foraminotomy was performed at both S1 nerve roots.  The ligamentum flavum adjacent to the medial facet at the L5-S1 level was found to be quite hypertrophic severe lateral recess stenosis, the pedicle was identified at S1 bilaterally.  An osteotomes were then used to resect further superior articular process  of S1, inferior articular process of L5 to lateral to the S1 nerve root and the nerve root was well decompressed with a lateral recess extending into the S1 foramen.  This was done first on the right side, then on the left side.  Hockey stick neuroprobe was passed out over the neuroforamen at L5 and further bone debrided using 0.25-inch osteotome about the L5 nerve root until adequate resection of both the reflected portion of ligamentum flavum and superior articular process of S1 allowed for, the hockey stick neuroprobe to be passed freely without difficulty indicating that the five-root was well decompressed.  Continuing up, the lateral recess on the right side, operating room microscope draped sterilely, brought into the field under the OR microscope and further dissection was carried out and decompression along the lateral recess on the right side at L4-5 such the L4 nerve root was identified  and traced out the foramen of L4.  A 3-mm Kerrison was used to resect bone, then overlying the L4 nerve root and this foramen was well opened such the hockey stick neuroprobe then could be passed out the foramen here. Then, at the L3-4, similarly the L3 nerve root and its neuroforamen were carefully decompressed and reflected ligamentum flavum.  The medial joint changes pressed upon the thecal sac at the L3 nerve root, using Kerrisons and osteotomes appropriate.  Each level, at least 7-8 mm of pars remained following the decompression medially.  Care was taken to ensure that adequate pars remained to prevent spondylolysis.  L2-3 right side lateral recess was decompressed and a hockey stick neuroprobe able to be passed out the L2 and L3 neuroforamen.  Attention turned to the left side where the similar process was carried out decompressing first the S1 nerve root, osteotomized further the medial facet of L5-S1 within the lateral recess, S1 nerve root was well decompressed as it entered the lateral recess and out the S1 neuroforamen.  The L5 nerve root similarly require decompression, it required removal of the superior portion of the S1 superior articular process as well as reflected ligamentum flavum.  Until Hockey stick neuroprobe could be passed out, demonstrating the L5 nerve roots were well decompressed.  Continuing up the left lateral gutter, then the L4 foramen was well decompressed using 3-mm Kerrison, reflected the ligamentum flavum and resected.  Then, at L3-4 similarly, decompressing L3 nerve root and lateral recess here. The L2-3 where it further decompression using osteotome, lateral recess well decompressed and hockey stick neuroprobe able to be passed out. The L2, L3, L4, L5 and S1 neuroforamen demonstrating patency.  Bone wax applied to bleeding cancellous bone surfaces.  Small epidural veins were cauterized using bipolar electrocautery.  Thrombin-soaked Gelfoam was placed  in the lateral gutters until adequate hemostasis obtained and then these were removed.  Irrigation was carried out, each of the disk on both sites were examined at L5-S1, L4-5, L3-4 and L2-3, and to ensure that there were no further disk herniation and each one demonstrated this bulge with hard disk protrusion, but no soft herniated disk material.  So that, the each disk was preserved.  Following further irrigation, the incision was completely dried.  All excess Gelfoam was removed and thin-layered Gelfoam placed over the posterior aspect of the thecal sac.  Following, Valsalva demonstrated no sign of dural leak. With this end, Viper retractor was released.  The patient had medium Hemovac drain placed in the depth of the incision exiting over the right lower lumbar spine.  Paralumbar muscles  were reapproximated midline loosely with interrupted 0 Vicryl sutures.  Lumbodorsal fascia was reattached to itself using figure-of-eight simple sutures of #1 Vicryl. Deep subcu layers were approximated with interrupted #1 Vicryl sutures, more superficial layers interrupted with 0 and 2-0 Vicryl sutures and the skin was closed with a running subcu stitch of 4-0 Vicryl. Dermabond was then applied and Mepilex bandage.  The patient was then returned to the supine position, reactivated, extubated and returned to recovery room in satisfactory condition.  All instruments and sponge counts were correct.  Note that, standard time-out protocol was carried out prior to the incision on this patient.  At the very initiation of the case, following marking the skin and prior to the incision, the red towel was used to ensure that time-out was carried out before incision.  PHYSICIAN ASSISTANT'S RESPONSIBILITY:  Maud Deed performed the duties of assistant surgeon during this case, operating under the microscope and also with loupe magnification.  She was present from the very beginning of the case to very end of  the case.  She assisted with the retraction of delicate neural tissues and suctioning of blood about the delicate neural tissue.  She assisted with cauterization of blood vessels were necessary using bipolar and unipolar electrocautery.  Under the operating room microscope, also assisted with this as well as further hemostasis.  At the end of the case, she performed closure from the fascial layer to the skin, application of dressing.  She assisted with positioning the patient with initial operative base.     Kerrin Champagne, M.D.     JEN/MEDQ  D:  08/25/2010  T:  08/26/2010  Job:  [id number]  Electronically Signed by Vira Browns M.D. on 08/28/2010 08:32:10 PM

## 2010-08-31 NOTE — Discharge Summary (Signed)
  [name], Casey Gibson             ACCOUNT NO.:  [id number]  MEDICAL RECORD NO.:  [id number]          PATIENT TYPE:  INP  LOCATION:  5008                         FACILITY:  [facility]  PHYSICIAN:  Calvert Cantor, M.D.     DATE OF BIRTH:  [birth year]  DATE OF ADMISSION:  07/10/2010 DATE OF DISCHARGE:  07/13/2010                              DISCHARGE SUMMARY   PRIMARY CARE PHYSICIAN:  Corwin Levins, MD  PRESENTING COMPLAINT:  Bilateral ankle pain.  DISCHARGE DIAGNOSES: 1. Bilateral ankle pain, gout versus cellulitis. 2. Psoriasis. 3. Non-insulin-dependent diabetes mellitus. 4. Hypertension. 5. Morbidly obese. 6. Back pain.  DISCHARGE MEDICATIONS: 1. Doxycycline 100 mg by mouth twice a day for 7 more days. 2. Nabumetone 750 mg by mouth twice a day for 7 more days. 3. Actos 45 mg daily. 4. Amlodipine 10 mg daily. 5. Aspirin 81 mg daily. 6. Benazepril 20 mg daily. 7. Glipizide XL 10 mg daily. 8. Oxycodone/acetaminophen 5/325 one to two tabs q.6 h as needed. 9. Tramadol 50 mg 1 tab twice a day as needed.  HOSPITAL COURSE:  This is a 65 year old male who came in with complaint of bilateral ankle pain.  The patient had presented to the ER the day before and was placed on antibiotics but continued to have pain.  It was suspected by the admitting physician that he may have gout.  However, the antibiotics were continued as well.  He was given vancomycin and then placed on doxycycline.  The patient's nabumetone was also continued.  By the following day, the patient had noted that he felt 70% better and he was able to walk with a walker.  He stated that his ankles were less swollen as well.  Today, he is having minimal ankle pain and is ready for discharge.  He is being discharged with a 1-week supply of doxycycline and advised to continue his Relafen as well.  CONDITION ON DISCHARGE:  Stable.  FOLLOWUP INSTRUCTIONS:  He is advised to follow up with primary care physician, Dr. Oliver Barre, as needed.  Time on discharge 45 minutes.     Calvert Cantor, M.D.     SR/MEDQ  D:  08/28/2010  T:  08/29/2010  Job:  [id number]  cc:   Corwin Levins, MD  Electronically Signed by Calvert Cantor M.D. on 08/31/2010 03:02:54 PM

## 2010-09-08 ENCOUNTER — Encounter: Payer: Self-pay | Admitting: Internal Medicine

## 2010-09-29 LAB — URINALYSIS, ROUTINE W REFLEX MICROSCOPIC
Nitrite: NEGATIVE
Specific Gravity, Urine: 1.025 (ref 1.005–1.030)
Urobilinogen, UA: 0.2 mg/dL (ref 0.0–1.0)
pH: 6 (ref 5.0–8.0)

## 2010-09-29 LAB — URINE MICROSCOPIC-ADD ON

## 2010-09-29 LAB — DIFFERENTIAL
Basophils Absolute: 0 10*3/uL (ref 0.0–0.1)
Basophils Relative: 0 % (ref 0–1)
Lymphocytes Relative: 18 % (ref 12–46)
Neutro Abs: 5.4 10*3/uL (ref 1.7–7.7)
Neutrophils Relative %: 70 % (ref 43–77)

## 2010-09-29 LAB — BASIC METABOLIC PANEL
BUN: 5 mg/dL — ABNORMAL LOW (ref 6–23)
Calcium: 9.3 mg/dL (ref 8.4–10.5)
Calcium: 9.4 mg/dL (ref 8.4–10.5)
Chloride: 101 mEq/L (ref 96–112)
Creatinine, Ser: 0.59 mg/dL (ref 0.4–1.5)
GFR calc Af Amer: >60 mL/min (ref >60)
GFR calc non Af Amer: >60 mL/min (ref >60)
Glucose, Bld: 159 mg/dL — ABNORMAL HIGH (ref 70–99)
Potassium: 3.3 mEq/L — ABNORMAL LOW (ref 3.5–5.1)
Sodium: 137 mEq/L (ref 135–145)
Sodium: 140 mEq/L (ref 135–145)

## 2010-09-29 LAB — GLUCOSE, CAPILLARY
Glucose-Capillary: 105 mg/dL — ABNORMAL HIGH (ref 70–99)
Glucose-Capillary: 144 mg/dL — ABNORMAL HIGH (ref 70–99)
Glucose-Capillary: 147 mg/dL — ABNORMAL HIGH (ref 70–99)
Glucose-Capillary: 182 mg/dL — ABNORMAL HIGH (ref 70–99)

## 2010-09-29 LAB — CBC
HCT: 37.4 % — ABNORMAL LOW (ref 39.0–52.0)
Hemoglobin: 13 g/dL (ref 13.0–17.0)
MCHC: 35.3 g/dL (ref 30.0–36.0)
MCV: 95.6 fL (ref 78.0–100.0)
Platelets: 314 10*3/uL (ref 150–400)
RBC: 3.85 MIL/uL — ABNORMAL LOW (ref 4.22–5.81)
RDW: 14.4 % (ref 11.5–15.5)
WBC: 7.3 10*3/uL (ref 4.0–10.5)
WBC: 7.7 10*3/uL (ref 4.0–10.5)

## 2010-09-29 LAB — URINE CULTURE: Culture: NO GROWTH

## 2010-09-29 LAB — POCT CARDIAC MARKERS: CKMB, poc: 2.1 ng/mL (ref 1.0–8.0)

## 2010-09-29 LAB — BRAIN NATRIURETIC PEPTIDE: Pro B Natriuretic peptide (BNP): <30 pg/mL (ref 0.0–100.0)

## 2010-09-29 LAB — URIC ACID: Uric Acid, Serum: 8.7 mg/dL — ABNORMAL HIGH (ref 4.0–7.8)

## 2010-09-30 NOTE — Letter (Signed)
Summary: [facility]   Imported By: Lennie Odor 09/25/2010 14:40:22  _____________________________________________________________________  External Attachment:    Type:   Image     Comment:   External Document

## 2010-10-08 NOTE — Discharge Summary (Signed)
Casey Gibson, Casey Gibson             ACCOUNT NO.:  [id number]  MEDICAL RECORD NO.:  [id number]           PATIENT TYPE:  I  LOCATION:  5015                         FACILITY:  [facility]  PHYSICIAN:  Kerrin Champagne, M.D.   DATE OF BIRTH:  [birth year]  DATE OF ADMISSION:  08/25/2010 DATE OF DISCHARGE:  08/28/2010                              DISCHARGE SUMMARY   ADMISSION DIAGNOSES: 1. Lumbar spinal stenosis L2-3, L3-4, L4-5, and L5-S1. 2. Hypertension 3. Diabetes mellitus. 4. Sleep apnea. 5. Psoriasis. 6. Recent hospitalization for cellulitis lower extremities.  DISCHARGE DIAGNOSIS: 1. Lumbar spinal stenosis L2-3, L3-4, L4-5, and L5-S1. 2. Hypertension 3. Diabetes mellitus. 4. Sleep apnea. 5. Psoriasis. 6. Recent hospitalization for cellulitis lower extremities.  PROCEDURE:  On August 25, 2010, the patient underwent central decompressive laminectomy at L2-3, L3-4, L4-5, and L5-S1 with resection of the spinous process and central portions of the lamina of each of these segments, bilateral foraminotomies at the L2, L3, L4, L5, and S1 nerve roots.  This was performed by Dr. Otelia Sergeant, assisted by Maud Deed, PA-C, under general anesthesia.  CONSULTATIONS:  None.  BRIEF HISTORY:  The patient is a 65 year old male with progressing symptoms of neurogenic claudication.  At this point, he is unable to ambulate more than 15 feet and has required wheelchair assistance.  He has undergone conservative treatment including epidural steroid injections which no longer gives him relief of his discomfort and inability to ambulate.  MRI study shows stenosis at L2-3, L3-4, L4-5, and L5-S1 with each segment showing significant degenerative disk changes and spondylitic changes.  It was felt that he would benefit from surgical intervention and was admitted for the procedure as stated above.  BRIEF HOSPITAL COURSE:  The patient tolerated the procedure without complications.  Postoperatively,  neurovascular motor function of the lower extremities was felt to be intact.  He was started on physical therapy for ambulation and gait training.  The patient did complain of edema of bilateral lower extremities.  He did have +1 pitting edema, however, no signs or symptoms of cellulitis.  He was treated with PAS hose and was instructed to use TEDs once the edema had resolved enough to wear the TED comfortably.  The patient's edema did improve with these treatments.  He was placed on PCA analgesics initially for his discomfort and was weaned to p.o. analgesics without difficulty.  The patient was on a clear liquid diet and this was progressed once his bowel function returned.  Dressing changes were done daily and the patient's wound was healing without signs of infection.  Neurovascular and motor function of the lower extremities continued to be intact throughout the hospital stay.  The patient's Foley catheter was discontinued, and he did complain of frequency with voiding but no urgency or burning.  A urinalysis obtained on August 28, 2010, was negative for urinary tract infection.  On August 28, 2010, the patient was stable for discharge.  At that time, he was walking in the hallway using a walker for assistance.  PERTINENT LABORATORY VALUES:  Hemoglobin and hematocrit on admission 10.4 and 30.7, postoperatively 9.8 and 29.0.  Chemistry studies on  admission within normal limits and postoperatively also within normal limits.  Hemoglobin A1c 7.2.  Urinalysis on August 28, 2010, was negative for urinary tract infection.  PLAN:  The patient was discharged to his home.  He was instructed to ambulate weightbearing as tolerated and utilize a walker for ambulation. He will continue on a diabetic diet.  He will change his dressing daily, but will be allowed to shower on August 29, 2009, if there is no drainage from the wound.  He will use an over-the-counter stool softener daily.  He  was instructed to wear his TED stockings during the day and to remove them at night.  Incentive spirometry was sent home with the patient for use.  The patient was given a medication reconciliation form with instructions to continue home medications as taken prior to admission.  He was given prescriptions for Percocet 5/325 one to two every 4-6 hours as needed for pain and Robaxin 500 mg one every 8 hours as needed for spasm.  He will follow up with Dr. Otelia Sergeant in 2 weeks from the date of the surgery.  All questions encouraged and answered.     Wende Neighbors, P.A.   ______________________________ Kerrin Champagne, M.D.    SMV/MEDQ  D:  09/23/2010  T:  09/24/2010  Job:  [id number]  Electronically Signed by Dorna Mai. on 09/25/2010 09:19:13 AM Electronically Signed by Vira Browns M.D. on 10/08/2010 11:20:20 PM

## 2010-12-05 NOTE — Op Note (Signed)
Casey Gibson, Casey Gibson             ACCOUNT NO.:  [id number]   MEDICAL RECORD NO.:  [id number]          PATIENT TYPE:  AMB   LOCATION:  [facility]                         FACILITY:  [facility]   PHYSICIAN:  Heloise Purpura, MD      DATE OF BIRTH:  1945/08/03   DATE OF PROCEDURE:  08/26/2006  DATE OF DISCHARGE:                               OPERATIVE REPORT   PREOPERATIVE DIAGNOSIS:  Phimosis.   POSTOPERATIVE DIAGNOSIS:  Phimosis.   PROCEDURE:  Circumcision.   SURGEON:  Dr. Heloise Purpura   ANESTHESIA:  General.   COMPLICATIONS:  None.   INDICATION:  Casey Gibson is a 65 year old gentleman with penile pain  and phimosis.  His foreskin was unable to be retracted in the office on  physical exam.  After discussing options with the patient, he elected to  proceed with circumcision.  Potential risks and benefits were discussed  with the patient, and he consented.   DESCRIPTION OF PROCEDURE:  The patient was taken to the operating room,  and a general anesthetic was administered.  He was given preoperative  antibiotics, placed in the supine position, and prepped and draped in  the usual sterile fashion.  Next, a preoperative time-out was performed.  The patient's foreskin was then retracted which was possible under  anesthesia, although he did have a tight preputial ring.  The proximal  and distal circumcision sites were then marked with a marking pen.  A  knife was then used to incise the skin circumferentially both at the  proximal and distal sites.  A hemostat was then placed at the 12 o'clock  position on the preputial skin to be excised.  This was then divided in  a dorsal slit procedure.  The foreskin was then removed with  electrocautery.  The underlying dartos tissue was examined and  hemostasis was ensured.  The proximal and distal skin edges were then  reapproximated in 4 quadrants with a U stitch at the frenulum.  Then 3-0  chromic sutures were used to reapproximate the skin edges  circumferentially in each quadrant.  A sterile dressing was applied.  The patient appeared to tolerate the procedure well without  complications.  The patient tolerated the procedure well without  complications.  He was able to be awakened and transferred to the  recovery unit in satisfactory condition.           ______________________________  Heloise Purpura, MD  Electronically Signed     LB/MEDQ  D:  08/26/2006  T:  08/26/2006  Job:  [id number]

## 2010-12-05 NOTE — H&P (Signed)
NAMEKENDREW, PACI             ACCOUNT NO.:  [id number]   MEDICAL RECORD NO.:  [id number]          PATIENT TYPE:  INP   LOCATION:  0103                         FACILITY:  [facility]   PHYSICIAN:  Theone Stanley, MD   DATE OF BIRTH:  02-Jan-1946   DATE OF ADMISSION:  04/29/2005  DATE OF DISCHARGE:                                HISTORY & PHYSICAL   CHIEF COMPLAINT:  Right ankle pain and skin infection.   HISTORY OF PRESENT ILLNESS:  Mr. Tokunaga is a 65 year old African-American  gentleman with a history of diabetes, psoriasis and hypertension presenting  secondary to right ankle pain and skin infection. The patient states that he  has had problems with his right ankle pain for some time and he was seeing  Dr. Margaretha Sheffield in regards to this. Dr. Margaretha Sheffield then referred the patient to a  foot specialist, it is unclear who it is. He stated he has arthritis and it  is most likely secondary to his psoriasis. In the meantime on Monday of last  week, he was given a cortisone shot in his ankle, a prednisone taper pack,  Vicodin which did not work and he was switched to OxyContin which also did  not work. He received also what appeared to be a pain shot at that time;  however, it wore off in about 2 hours. In addition, he was given Celebrex on  Monday and then he broke out in his axillary area in his creases along his  groin and below his abdomen with blisters. He called the office and they  told him to stop the Celebrex. However, the problem got progressively worse  and despite trying to keep the area clean it again started to ooze a  whitish, off whitish type material and then it had an awful foul smell  starting on Sunday. Complicating the issue is that his foot was causing him  an increasing amount of pain which limited his mobility. He describes the  pain as a constant pain. He cannot stand on that right foot and in fact he  started to eat less and less so that he would not have to go to the  bathroom  frequently. In regards to his diabetes, he states his blood sugar is around  86 to 104; however, he only checks his blood sugars once a month. He is seen  at Urgent [facility] at [facility] and they were doing labs on him including, I  suspect, a hemoglobin A1c, also a 24 hour urine analysis. He does not know  of these results.   PAST MEDICAL HISTORY:  He has psoriasis diagnosed 15 years ago. He was going  to Dr. Lucretia Roers receiving ultraviolet therapy and according to the patient,  expensive medications, he stopped that, started using Vaseline and then his  nephew has real bad psoriasis and he started to use his steroid cream.  Hypertension, diabetes.   MEDICATIONS:  1.  Norvasc 10 mg 1 p.o. daily.  2.  Aspirin 81 mg 1 p.o. daily.  3.  The patient is on some type of oral glycemic medication. He is unclear  as to what it is. We need to contact his wife to find out.  4.  Recently he was started on a prednisone pack, Percocet and he is taking      that intermittent cream triamcinolone 0.1% cream p.r.n.   ALLERGIES:  CELEBREX.   FAMILY HISTORY:  Hypertension, diabetes.   SOCIAL HISTORY:  The patient lives in [place], is marred, has 2 grown  children. He smokes 2-4 cigars a day, drinks about 4-5 times a week but does  not seem to indicate excessive drinking, no illicit drug use.   REVIEW OF SYMPTOMS:  The patient has shaking and it is unclear whether this  is chills. He is not eating much because he has no appetite and in addition  he does not want to be mobile because of his pain. He denies any changes in  urinary habits.  He does state that he has been constipated since starting  his pain medication. No intolerance to heat or cold. No cough, no wheezing,  no chest pain, no shortness of breath.   PHYSICAL EXAMINATION:  VITAL SIGNS:  Temperature of 99, blood pressure  146/80, pulse of 109, respiratory rate of 16, satting 100% on room air.  HEENT:  Head atraumatic,   normocephalic. Eyes 3 mm, pupils reactive to  light. Extraocular movements intact. Ears and nose no discharge. Throat  clear. No erythema, no exudate, mucosa moist.  NECK:  Supple, no lymphadenopathy, no JVD.  HEART:  Regular rate and rhythm, no murmurs, rubs or gallops appreciated.  LUNGS:  Clear to auscultation bilaterally.  ABDOMEN:  Soft, nontender, nondistended.  EXTREMITIES:  No edema, no cyanosis or clubbing. The patient had slight  increased what appeared to be swelling in  his right ankle; however, there  was no effusion present.  SKIN:  The patient had evidence of psoriasis present along the extensor area  of bilateral arms. In addition, he had little areas all over his body. In  regards to his infection, he had raw areas along the axillary area. In  addition, he had quite a bit of raw and oozing off whitish material with  foul smelling below his abdomen and in the groin area along the creases. He  had some along the gluteal folds but the majority of the oozing was from the  groin area. The exam was somewhat limited by the patient's pain; however,  there did not appear to be any excessive softness to the area or any  fluctuance.   LABORATORY DATA:  White count of 8, hemoglobin of 14, hematocrit of 42,  platelets at 427. Differential did not show any bands. Sodium 128, potassium  of 4.5, chloride at 94, CO2 of 23. Glucose at 353, BUN at 18, creatinine of  0.9, calcium at 9.4. No anion gap. Wound culture pending.   ASSESSMENT/PLAN:  1.  Skin infection does not appear to be a classical cellulitis. However, it      is definitely a skin infection. Will continue with the Zosyn that was      initially started in the ER because it is broad spectrum. Obtain a blood      culture, followup on wound cultures. We will hold off on any prednisone      at this point in time, get a wound care consult. Because the patient is     diabetic, I have to be concerned about necrotizing fasciitis;  however,      it appears his sugars are well controlled. In addition,  he has no      elevated white count, low grade fever and no bands. This is less likely      in the differential. However, if anything changes will reexamine and      also obtain a CT scan if necessary.  2.  History of diabetes. Currently the patient has hyperglycemia, however,      suspect because of infection this is causing exacerbation. However, it      is unclear whether he does actually have good control. Will obtain a      hemoglobin A1c, in the meantime start him on insulin sliding scale and      if necessary give him long acting insulin.  3.  Hypertension. Will continue him on his Norvasc and add any medication if      necessary.  4.  Right ankle pain. This appears to be possibly an arthritis secondary to      cirrhosis. Will continue with Dilaudid, Tylenol and oxycodone as needed      for pain control. In addition, I will try to contact the foot specialist      that the patient was talking about.  5.  Prophylaxis. We will start him on Lovenox for DVT prophylaxis and      protonix.     Theone Stanley, MD  Electronically Signed    AEJ/MEDQ  D:  04/29/2005  T:  04/29/2005  Job:  [id number]

## 2011-06-30 ENCOUNTER — Encounter: Payer: BC Managed Care – PPO | Attending: Physical Medicine & Rehabilitation

## 2011-06-30 ENCOUNTER — Ambulatory Visit: Payer: BLUE CROSS/BLUE SHIELD | Admitting: Physical Medicine & Rehabilitation

## 2011-07-27 IMAGING — CR DG CHEST 1V PORT
1 series · 1 of 1 positions shown · non-contrast
Comparison: 08/24/2006

CLINICAL DATA: Swollen feet, leg pain

PORTABLE CHEST - 1 VIEW

[AP]
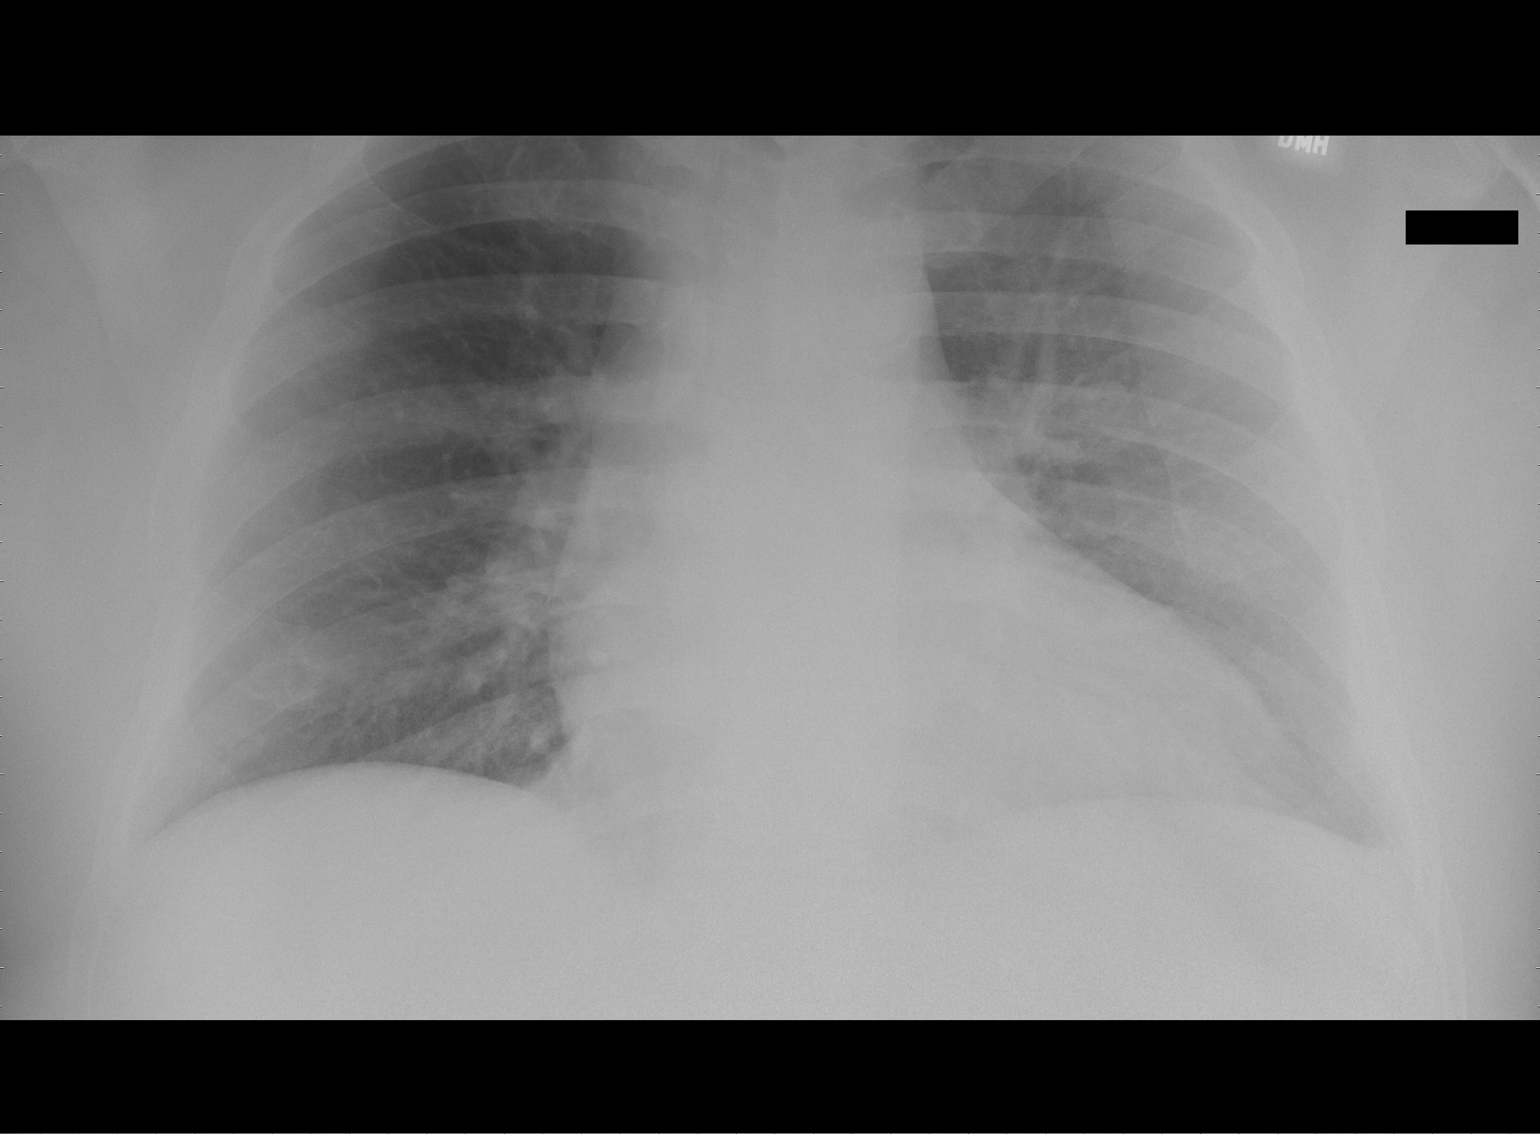

[1 of 1 positions shown; findings below may reference images not displayed]

FINDINGS: Cardiac enlargement without heart failure.  Negative for
pneumonia or effusion.
IMPRESSION: Cardiac enlargement.  No acute cardiopulmonary disease.

## 2014-04-16 ENCOUNTER — Encounter: Payer: Self-pay | Admitting: Internal Medicine

## 2016-10-23 ENCOUNTER — Encounter (HOSPITAL_COMMUNITY): Payer: Self-pay | Admitting: Emergency Medicine

## 2016-10-23 ENCOUNTER — Inpatient Hospital Stay (HOSPITAL_COMMUNITY)
Admission: EM | Admit: 2016-10-23 | Discharge: 2016-10-28 | DRG: 603 | Disposition: A | Discharge status: Home / Self Care | Payer: Medicare Other | Attending: Internal Medicine | Admitting: Internal Medicine

## 2016-10-23 DIAGNOSIS — E860 Dehydration: Secondary | ICD-10-CM | POA: Diagnosis present

## 2016-10-23 DIAGNOSIS — E119 Type 2 diabetes mellitus without complications: Secondary | ICD-10-CM | POA: Diagnosis not present

## 2016-10-23 DIAGNOSIS — L4050 Arthropathic psoriasis, unspecified: Secondary | ICD-10-CM | POA: Diagnosis present

## 2016-10-23 DIAGNOSIS — D62 Acute posthemorrhagic anemia: Secondary | ICD-10-CM | POA: Diagnosis present

## 2016-10-23 DIAGNOSIS — D6959 Other secondary thrombocytopenia: Secondary | ICD-10-CM | POA: Diagnosis present

## 2016-10-23 DIAGNOSIS — D696 Thrombocytopenia, unspecified: Secondary | ICD-10-CM | POA: Diagnosis not present

## 2016-10-23 DIAGNOSIS — Z6841 Body Mass Index (BMI) 40.0 and over, adult: Secondary | ICD-10-CM

## 2016-10-23 DIAGNOSIS — L8990 Pressure ulcer of unspecified site, unspecified stage: Secondary | ICD-10-CM | POA: Diagnosis present

## 2016-10-23 DIAGNOSIS — E871 Hypo-osmolality and hyponatremia: Secondary | ICD-10-CM | POA: Diagnosis present

## 2016-10-23 DIAGNOSIS — Z880 Allergy status to penicillin: Secondary | ICD-10-CM

## 2016-10-23 DIAGNOSIS — B379 Candidiasis, unspecified: Secondary | ICD-10-CM | POA: Diagnosis present

## 2016-10-23 DIAGNOSIS — D693 Immune thrombocytopenic purpura: Secondary | ICD-10-CM | POA: Diagnosis present

## 2016-10-23 DIAGNOSIS — E669 Obesity, unspecified: Secondary | ICD-10-CM | POA: Diagnosis not present

## 2016-10-23 DIAGNOSIS — L304 Erythema intertrigo: Secondary | ICD-10-CM | POA: Diagnosis present

## 2016-10-23 DIAGNOSIS — L0390 Cellulitis, unspecified: Secondary | ICD-10-CM

## 2016-10-23 DIAGNOSIS — N179 Acute kidney failure, unspecified: Secondary | ICD-10-CM | POA: Insufficient documentation

## 2016-10-23 DIAGNOSIS — F1020 Alcohol dependence, uncomplicated: Secondary | ICD-10-CM | POA: Diagnosis not present

## 2016-10-23 DIAGNOSIS — Z7952 Long term (current) use of systemic steroids: Secondary | ICD-10-CM | POA: Diagnosis not present

## 2016-10-23 DIAGNOSIS — E872 Acidosis, unspecified: Secondary | ICD-10-CM

## 2016-10-23 DIAGNOSIS — L409 Psoriasis, unspecified: Secondary | ICD-10-CM | POA: Diagnosis not present

## 2016-10-23 DIAGNOSIS — Z888 Allergy status to other drugs, medicaments and biological substances status: Secondary | ICD-10-CM | POA: Diagnosis not present

## 2016-10-23 DIAGNOSIS — I10 Essential (primary) hypertension: Secondary | ICD-10-CM | POA: Diagnosis present

## 2016-10-23 DIAGNOSIS — B999 Unspecified infectious disease: Secondary | ICD-10-CM | POA: Diagnosis not present

## 2016-10-23 DIAGNOSIS — F17200 Nicotine dependence, unspecified, uncomplicated: Secondary | ICD-10-CM | POA: Diagnosis present

## 2016-10-23 DIAGNOSIS — E1165 Type 2 diabetes mellitus with hyperglycemia: Secondary | ICD-10-CM | POA: Diagnosis present

## 2016-10-23 DIAGNOSIS — L03311 Cellulitis of abdominal wall: Secondary | ICD-10-CM | POA: Diagnosis not present

## 2016-10-23 DIAGNOSIS — D899 Disorder involving the immune mechanism, unspecified: Secondary | ICD-10-CM | POA: Diagnosis present

## 2016-10-23 DIAGNOSIS — F1090 Alcohol use, unspecified, uncomplicated: Secondary | ICD-10-CM

## 2016-10-23 DIAGNOSIS — Z882 Allergy status to sulfonamides status: Secondary | ICD-10-CM

## 2016-10-23 DIAGNOSIS — K121 Other forms of stomatitis: Secondary | ICD-10-CM | POA: Diagnosis not present

## 2016-10-23 DIAGNOSIS — D61818 Other pancytopenia: Secondary | ICD-10-CM | POA: Diagnosis present

## 2016-10-23 DIAGNOSIS — B370 Candidal stomatitis: Secondary | ICD-10-CM | POA: Diagnosis not present

## 2016-10-23 DIAGNOSIS — R1310 Dysphagia, unspecified: Secondary | ICD-10-CM | POA: Diagnosis present

## 2016-10-23 DIAGNOSIS — Z789 Other specified health status: Secondary | ICD-10-CM

## 2016-10-23 DIAGNOSIS — R739 Hyperglycemia, unspecified: Secondary | ICD-10-CM | POA: Diagnosis not present

## 2016-10-23 DIAGNOSIS — L089 Local infection of the skin and subcutaneous tissue, unspecified: Secondary | ICD-10-CM | POA: Diagnosis not present

## 2016-10-23 DIAGNOSIS — E1169 Type 2 diabetes mellitus with other specified complication: Secondary | ICD-10-CM | POA: Diagnosis not present

## 2016-10-23 DIAGNOSIS — L98499 Non-pressure chronic ulcer of skin of other sites with unspecified severity: Secondary | ICD-10-CM | POA: Diagnosis not present

## 2016-10-23 DIAGNOSIS — E876 Hypokalemia: Secondary | ICD-10-CM | POA: Diagnosis present

## 2016-10-23 DIAGNOSIS — T380X5A Adverse effect of glucocorticoids and synthetic analogues, initial encounter: Secondary | ICD-10-CM | POA: Diagnosis present

## 2016-10-23 DIAGNOSIS — Z7984 Long term (current) use of oral hypoglycemic drugs: Secondary | ICD-10-CM

## 2016-10-23 DIAGNOSIS — Z7289 Other problems related to lifestyle: Secondary | ICD-10-CM

## 2016-10-23 DIAGNOSIS — B9689 Other specified bacterial agents as the cause of diseases classified elsewhere: Secondary | ICD-10-CM | POA: Diagnosis not present

## 2016-10-23 DIAGNOSIS — E8729 Other acidosis: Secondary | ICD-10-CM

## 2016-10-23 DIAGNOSIS — L03314 Cellulitis of groin: Secondary | ICD-10-CM | POA: Diagnosis present

## 2016-10-23 DIAGNOSIS — Z79899 Other long term (current) drug therapy: Secondary | ICD-10-CM | POA: Diagnosis not present

## 2016-10-23 DIAGNOSIS — L03019 Cellulitis of unspecified finger: Secondary | ICD-10-CM | POA: Diagnosis not present

## 2016-10-23 HISTORY — DX: Psoriasis, unspecified: L40.9

## 2016-10-23 HISTORY — DX: Type 2 diabetes mellitus without complications: E11.9

## 2016-10-23 HISTORY — DX: Essential (primary) hypertension: I10

## 2016-10-23 LAB — CBC WITH DIFFERENTIAL/PLATELET
BASOS ABS: 0 10*3/uL (ref 0.0–0.1)
BASOS PCT: 0 %
EOS ABS: 0 10*3/uL (ref 0.0–0.7)
Eosinophils Relative: 0 %
HEMATOCRIT: 34.3 % — AB (ref 39.0–52.0)
Hemoglobin: 12.2 g/dL — ABNORMAL LOW (ref 13.0–17.0)
LYMPHS ABS: 0.6 10*3/uL — AB (ref 0.7–4.0)
Lymphocytes Relative: 24 %
MCH: 34 pg (ref 26.0–34.0)
MCHC: 35.6 g/dL (ref 30.0–36.0)
MCV: 95.5 fL (ref 78.0–100.0)
Monocytes Absolute: 0 10*3/uL — ABNORMAL LOW (ref 0.1–1.0)
Monocytes Relative: 0 %
NEUTROS ABS: 1.8 10*3/uL (ref 1.7–7.7)
Neutrophils Relative %: 76 %
Platelets: 96 10*3/uL — ABNORMAL LOW (ref 150–400)
RBC: 3.59 MIL/uL — ABNORMAL LOW (ref 4.22–5.81)
RDW: 14.1 % (ref 11.5–15.5)
WBC: 2.4 10*3/uL — ABNORMAL LOW (ref 4.0–10.5)

## 2016-10-23 LAB — COMPREHENSIVE METABOLIC PANEL
ALT: 45 U/L (ref 17–63)
ANION GAP: 16 — AB (ref 5–15)
AST: 38 U/L (ref 15–41)
Albumin: 3.1 g/dL — ABNORMAL LOW (ref 3.5–5.0)
Alkaline Phosphatase: 70 U/L (ref 38–126)
BILIRUBIN TOTAL: 1.4 mg/dL — AB (ref 0.3–1.2)
BUN: 34 mg/dL — ABNORMAL HIGH (ref 6–20)
CO2: 22 mmol/L (ref 22–32)
Calcium: 9.1 mg/dL (ref 8.9–10.3)
Chloride: 90 mmol/L — ABNORMAL LOW (ref 101–111)
Creatinine, Ser: 1.35 mg/dL — ABNORMAL HIGH (ref 0.61–1.24)
GFR calc Af Amer: >60 mL/min (ref >60)
GFR, EST NON AFRICAN AMERICAN: 52 mL/min — AB (ref >60)
Glucose, Bld: 505 mg/dL (ref 65–99)
POTASSIUM: 5.1 mmol/L (ref 3.5–5.1)
Sodium: 128 mmol/L — ABNORMAL LOW (ref 135–145)
TOTAL PROTEIN: 7.4 g/dL (ref 6.5–8.1)

## 2016-10-23 LAB — CBG MONITORING, ED
Glucose-Capillary: 532 mg/dL (ref 65–99)
Glucose-Capillary: 434 mg/dL — ABNORMAL HIGH (ref 65–99)
Glucose-Capillary: 254 mg/dL — ABNORMAL HIGH (ref 65–99)

## 2016-10-23 LAB — GLUCOSE, CAPILLARY
Glucose-Capillary: 182 mg/dL — ABNORMAL HIGH (ref 65–99)
Glucose-Capillary: 253 mg/dL — ABNORMAL HIGH (ref 65–99)

## 2016-10-23 LAB — I-STAT CG4 LACTIC ACID, ED: LACTIC ACID, VENOUS: 3.56 mmol/L — AB (ref 0.5–1.9)

## 2016-10-23 MED ORDER — SODIUM CHLORIDE 0.9 % IV SOLN
2500.0000 mg | Freq: Once | INTRAVENOUS | Status: AC
  Administered 2016-10-23: 2500 mg via INTRAVENOUS
  Filled 2016-10-23: qty 2500

## 2016-10-23 MED ORDER — ASPIRIN EC 81 MG PO TBEC
81.0000 mg | DELAYED_RELEASE_TABLET | Freq: Every day | ORAL | Status: DC
  Administered 2016-10-24 – 2016-10-25 (×2): 81 mg via ORAL
  Filled 2016-10-23 (×3): qty 1

## 2016-10-23 MED ORDER — SODIUM CHLORIDE 0.9 % IV SOLN
INTRAVENOUS | Status: DC
  Administered 2016-10-23: 3.7 [IU]/h via INTRAVENOUS
  Filled 2016-10-23: qty 2.5

## 2016-10-23 MED ORDER — DOCUSATE SODIUM 100 MG PO CAPS
100.0000 mg | ORAL_CAPSULE | Freq: Two times a day (BID) | ORAL | Status: DC
  Administered 2016-10-23 – 2016-10-27 (×7): 100 mg via ORAL
  Filled 2016-10-23 (×9): qty 1

## 2016-10-23 MED ORDER — HYDROMORPHONE HCL 1 MG/ML IJ SOLN
0.5000 mg | Freq: Once | INTRAMUSCULAR | Status: AC
  Administered 2016-10-23: 0.5 mg via INTRAVENOUS
  Filled 2016-10-23: qty 1

## 2016-10-23 MED ORDER — OXYCODONE HCL 5 MG PO TABS
5.0000 mg | ORAL_TABLET | ORAL | Status: DC | PRN
  Administered 2016-10-23 – 2016-10-28 (×14): 5 mg via ORAL
  Filled 2016-10-23 (×15): qty 1

## 2016-10-23 MED ORDER — FLUCONAZOLE 100 MG PO TABS
400.0000 mg | ORAL_TABLET | Freq: Once | ORAL | Status: AC
  Administered 2016-10-23: 400 mg via ORAL
  Filled 2016-10-23: qty 4

## 2016-10-23 MED ORDER — INSULIN ASPART 100 UNIT/ML ~~LOC~~ SOLN
0.0000 [IU] | Freq: Three times a day (TID) | SUBCUTANEOUS | Status: DC
  Administered 2016-10-24: 8 [IU] via SUBCUTANEOUS
  Administered 2016-10-24: 5 [IU] via SUBCUTANEOUS
  Administered 2016-10-24: 11 [IU] via SUBCUTANEOUS
  Administered 2016-10-25: 2 [IU] via SUBCUTANEOUS
  Administered 2016-10-25 – 2016-10-28 (×6): 3 [IU] via SUBCUTANEOUS

## 2016-10-23 MED ORDER — SODIUM CHLORIDE 0.9 % IV BOLUS (SEPSIS)
1000.0000 mL | Freq: Once | INTRAVENOUS | Status: AC
  Administered 2016-10-23: 1000 mL via INTRAVENOUS

## 2016-10-23 MED ORDER — DEXTROSE-NACL 5-0.45 % IV SOLN: INTRAVENOUS | Status: DC

## 2016-10-23 MED ORDER — ONDANSETRON HCL 4 MG/2ML IJ SOLN: 4.0000 mg | Freq: Four times a day (QID) | INTRAMUSCULAR | Status: DC | PRN

## 2016-10-23 MED ORDER — ENOXAPARIN SODIUM 40 MG/0.4ML ~~LOC~~ SOLN
40.0000 mg | SUBCUTANEOUS | Status: DC
  Administered 2016-10-23: 40 mg via SUBCUTANEOUS
  Filled 2016-10-23: qty 0.4

## 2016-10-23 MED ORDER — INSULIN ASPART 100 UNIT/ML ~~LOC~~ SOLN
10.0000 [IU] | Freq: Once | SUBCUTANEOUS | Status: AC
Start: 2016-10-23 — End: 2016-10-23
  Administered 2016-10-23: 10 [IU] via INTRAVENOUS
  Filled 2016-10-23: qty 1

## 2016-10-23 MED ORDER — ONDANSETRON HCL 4 MG/2ML IJ SOLN
4.0000 mg | Freq: Once | INTRAMUSCULAR | Status: AC
  Administered 2016-10-23: 4 mg via INTRAVENOUS
  Filled 2016-10-23: qty 2

## 2016-10-23 MED ORDER — INSULIN GLARGINE 100 UNIT/ML ~~LOC~~ SOLN: 10.0000 [IU] | Freq: Every day | SUBCUTANEOUS | Status: DC

## 2016-10-23 MED ORDER — ACETAMINOPHEN 325 MG PO TABS: 650.0000 mg | ORAL_TABLET | Freq: Four times a day (QID) | ORAL | Status: DC | PRN

## 2016-10-23 MED ORDER — FLUCONAZOLE 100 MG PO TABS
400.0000 mg | ORAL_TABLET | ORAL | Status: DC
  Administered 2016-10-24 – 2016-10-27 (×4): 400 mg via ORAL
  Filled 2016-10-23 (×4): qty 4

## 2016-10-23 MED ORDER — ACETAMINOPHEN 650 MG RE SUPP: 650.0000 mg | Freq: Four times a day (QID) | RECTAL | Status: DC | PRN

## 2016-10-23 MED ORDER — ONDANSETRON HCL 4 MG PO TABS: 4.0000 mg | ORAL_TABLET | Freq: Four times a day (QID) | ORAL | Status: DC | PRN

## 2016-10-23 MED ORDER — SODIUM CHLORIDE 0.9 % IV SOLN
INTRAVENOUS | Status: DC
  Administered 2016-10-23 – 2016-10-24 (×2): via INTRAVENOUS

## 2016-10-23 MED ORDER — INSULIN ASPART 100 UNIT/ML ~~LOC~~ SOLN
0.0000 [IU] | Freq: Every day | SUBCUTANEOUS | Status: DC
  Administered 2016-10-23: 3 [IU] via SUBCUTANEOUS
  Administered 2016-10-25: 2 [IU] via SUBCUTANEOUS

## 2016-10-23 MED ORDER — VANCOMYCIN HCL IN DEXTROSE 1-5 GM/200ML-% IV SOLN
1000.0000 mg | Freq: Two times a day (BID) | INTRAVENOUS | Status: DC
  Administered 2016-10-24 (×2): 1000 mg via INTRAVENOUS
  Filled 2016-10-23 (×3): qty 200

## 2016-10-23 MED ORDER — FOLIC ACID 1 MG PO TABS: 1.0000 mg | ORAL_TABLET | Freq: Every day | ORAL | Status: DC

## 2016-10-23 MED ORDER — PIPERACILLIN-TAZOBACTAM 3.375 G IVPB 30 MIN
3.3750 g | Freq: Once | INTRAVENOUS | Status: AC
  Administered 2016-10-23: 3.375 g via INTRAVENOUS
  Filled 2016-10-23: qty 50

## 2016-10-23 MED ORDER — PREDNISONE 5 MG PO TABS
20.0000 mg | ORAL_TABLET | Freq: Every day | ORAL | Status: DC
  Administered 2016-10-24: 20 mg via ORAL
  Filled 2016-10-23: qty 4

## 2016-10-23 MED ORDER — INSULIN GLARGINE 100 UNIT/ML ~~LOC~~ SOLN
10.0000 [IU] | Freq: Every day | SUBCUTANEOUS | Status: DC
  Filled 2016-10-23: qty 0.1

## 2016-10-23 MED ORDER — AMLODIPINE BESYLATE 10 MG PO TABS
10.0000 mg | ORAL_TABLET | Freq: Every day | ORAL | Status: DC
Start: 2016-10-24 — End: 2016-10-28
  Administered 2016-10-24 – 2016-10-28 (×5): 10 mg via ORAL
  Filled 2016-10-23 (×6): qty 1

## 2016-10-23 MED ORDER — PIPERACILLIN-TAZOBACTAM 3.375 G IVPB
3.3750 g | Freq: Three times a day (TID) | INTRAVENOUS | Status: DC
  Administered 2016-10-24 (×3): 3.375 g via INTRAVENOUS
  Filled 2016-10-23 (×6): qty 50

## 2016-10-23 NOTE — ED Triage Notes (Signed)
Pt sts hx of psoriasis and having draining wounds from rash

## 2016-10-23 NOTE — ED Provider Notes (Signed)
[facility]-EMERGENCY DEPT Provider Note   CSN: [id number] Arrival date & time: 10/23/16  1202     History   Chief Complaint Chief Complaint  Patient presents with  . Rash    HPI Casey Gibson is a 71 y.o. male who presents emergency Department with chief complaint of painful rash. He has a past medical history of morbid obesity, diabetes, psoriasis and psoriatic arthritis for which he takes methotrexate. The patient saw his rheumatologist last Monday and got a shot of methotrexate. He states that over the course of the week. He developed a mouth rash, rash in the folds of his abdomen, groin and gluteal cleft. It has worsened over the past week with skin breakdown, ulcerations, discharge, foul odor. He has had associated chills and myalgias. The patient had a second shot of methotrexate this past Monday and his symptoms seemed to worsen. He denies taking any steroids. He is unsure of how high his sugars have been at home and he has not been checking them. He has had similar symptoms like this before.   HPI  Past Medical History:  Diagnosis Date  . Diabetes mellitus without complication (HCC)   . Hypertension   . Psoriasis     Patient Active Problem List   Diagnosis Date Noted  . DISC DISEASE, LUMBAR 06/26/2010  . SLEEP APNEA, OBSTRUCTIVE 12/06/2009  . INSOMNIA-SLEEP DISORDER-UNSPEC 11/19/2009  . HYPERSOMNIA 11/19/2009  . PERIPHERAL EDEMA 11/19/2009  . KNEE PAIN, BILATERAL 05/18/2008  . LEG PAIN, BILATERAL 05/18/2008  . DIABETES MELLITUS, TYPE II 06/06/2007  . HYPERLIPIDEMIA 06/06/2007  . HYPERTENSION 06/06/2007  . OSTEOARTHROSIS UNSPEC WHETHER GEN/LOC ANK&FOOT 06/06/2007    History reviewed. No pertinent surgical history.     Home Medications    Prior to Admission medications   Medication Sig Start Date End Date Taking? Authorizing Provider  amLODipine (NORVASC) 10 MG tablet Take 10 mg by mouth daily. 07/31/16  Yes Historical Provider, MD  benazepril-hydrochlorthiazide  (LOTENSIN HCT) 20-12.5 MG tablet Take 1 tablet by mouth daily. 09/26/16  Yes Historical Provider, MD  glipiZIDE (GLUCOTROL) 10 MG tablet Take 10 mg by mouth 2 (two) times daily. 09/04/16  Yes Historical Provider, MD  metFORMIN (GLUCOPHAGE) 1000 MG tablet Take 1,000 mg by mouth 2 (two) times daily. 09/22/16  Yes Historical Provider, MD  Methotrexate Sodium (METHOTREXATE, PF,) 50 MG/2ML injection Inject 0.024 mg into the muscle once a week.  10/12/16  Yes Historical Provider, MD    Family History History reviewed. No pertinent family history.  Social History Social History  Substance Use Topics  . Smoking status: Current Every Day Smoker  . Smokeless tobacco: Never Used  . Alcohol use Yes     Allergies   Celecoxib; Penicillins; Simvastatin; and Sulfonamide derivatives   Review of Systems Review of Systems  Constitutional: Positive for chills.  Musculoskeletal: Positive for myalgias.  Skin: Positive for color change, rash and wound.     Physical Exam Updated Vital Signs BP 135/67 (BP Location: Right Arm)   Pulse 86   Temp 97.9 F (36.6 C) (Oral)   Resp 18   SpO2 100%   Physical Exam  Constitutional: He appears well-developed and well-nourished. No distress.  HENT:  Head: Normocephalic and atraumatic.  Geographic tongue White patches over the tongue Erythematous, throat  Eyes: Conjunctivae are normal. No scleral icterus.  Neck: Normal range of motion. Neck supple.  Cardiovascular: Normal rate, regular rhythm and normal heart sounds.   Pulmonary/Chest: Effort normal and breath sounds normal. No respiratory distress.  Abdominal: Soft. There is no tenderness.  Musculoskeletal: He exhibits no edema.  Neurological: He is alert.  Skin: Skin is warm and dry. He is not diaphoretic.  Macerated, tender, erythematous tissue folds foul odor and greenish discharge.  Tissues are swollen. Multiple areas of ulcerations on the abdomen, groin, and gluteal cleft   Psychiatric: His  behavior is normal.  Nursing note and vitals reviewed.    ED Treatments / Results  Labs (all labs ordered are listed, but only abnormal results are displayed) Labs Reviewed  COMPREHENSIVE METABOLIC PANEL  CBC WITH DIFFERENTIAL/PLATELET    EKG  EKG Interpretation None       Radiology No results found.  Procedures Procedures (including critical care time)  Medications Ordered in ED Medications  sodium chloride 0.9 % bolus 1,000 mL (not administered)     Initial Impression / Assessment and Plan / ED Course  I have reviewed the triage vital signs and the nursing notes.  Pertinent labs & imaging results that were available during my care of the patient were reviewed by me and considered in my medical decision making (see chart for details).  Clinical Course as of Oct 23 1600  Fri Oct 23, 2016  1523 Patient with elevated lactic acid. I do not feel that this represents sepsis. However, patient is receiving IV antibiotics. I think this more likely secondary to dehydration given the fact that his blood sugars 532. Patient does have a white count of 2.4. Patient will receive fluid dosing. Lactic Acid, Venous: (!!) 3.56 [AH]  1559 Glucose-Capillary: (!!) 532 [AH]  [id number] Patient is leukopenic WBC: (!) 2.4 [AH]  1600 Patient has an anion gap. Anion gap: (!) 16 [AH]    Clinical Course User Index [AH] Vertis Bauder, PA-C    4:11 PM BP (!) 132/58   Pulse 87   Temp 97.9 F (36.6 C) (Oral)   Resp 18   Ht  (1.727 m)   Wt 126.1 kg   SpO2 97%   BMI 42.27 kg/m   Patient will be admitted to the floor. He tells me that his rheumathologist has also had him on prednisone. I believe he has a bacterial/fungal superinfetion. Blood cultures ordered. Patient receiving zosyn/ vanc/ and fluconazole. His pain is greatly improved after 0.5 mg of Dilaudid.  Final Clinical Impressions(s) / ED Diagnoses   Final diagnoses:  Superinfection  Thrush  Pancytopenia (HCC)  Hyperglycemia    Metabolic acidosis, increased anion gap (IAG)  Lactic acidosis    New Prescriptions New Prescriptions   No medications on file     Arthor Captain, PA-C 10/23/16 1621    Linwood Dibbles, MD 10/25/16 [phone]

## 2016-10-23 NOTE — H&P (Addendum)
History and Physical    Casey Gibson WUJ:[id number] DOB: Aug 04, 1945 DOA: 10/23/2016  PCP: [name], MD  Patient coming from:home  Chief Complaint:abdomen wound and discharge  HPI: Casey Gibson is a 71 y.o. male with medical history significant of hypertension, obesity, psoriasis on prednisone and weekly methotrexate, diabetes mellitus presented to the hospital for the evaluation of abdominal rash, wound, discharge. Patient reported that he has psoriasis for long time and gets weekly injection of methotrexate and oral prednisone. He has wound on his abdomen and pannus for about 1-2 weeks. Recently he noticed that the wound rupture and discharge associated with pain. He also developed wound around the sacral and perianal area. Reported chills but denied fever, headache, dizziness, nausea, vomiting, chest pain or shortness of breath. He takes oral medication for diabetes. He lives by himself and able to ambulate with the help of cane. Reports his smoking cigar but no cigarettes. Drinks alcohol occasionally in social events. Denied any illicit drug use. ED Course: In the ER patient was found to have hyperglycemia with blood sugar level around 500, elevated lactic acid level, hyponatremia. Treated with vancomycin and Zosyn and fluconazole. Admitted for further evaluation.  Review of Systems: As per HPI otherwise 10 point review of systems negative.    Past Medical History:  Diagnosis Date  . Diabetes mellitus without complication (HCC)   . Hypertension   . Psoriasis     Surgery History reviewed. No pertinent surgical history.   Social history: reports that he has been smoking.  He has never used smokeless tobacco. He reports that he drinks alcohol. He reports that he does not use drugs.  Allergies  Allergen Reactions  . Simvastatin Other (See Comments)    REACTION: ankle swelling/fluid retention, myalgias  . Celecoxib Hives, Itching, Rash and Other (See Comments)  .  Sulfonamide Derivatives Hives, Itching and Rash  . Penicillins Itching    Family History reviewed. No pertinent family history.   Prior to Admission medications   Medication Sig Start Date End Date Taking? Authorizing Provider  amLODipine (NORVASC) 10 MG tablet Take 10 mg by mouth daily. 07/31/16  Yes Historical Provider, MD  aspirin EC 81 MG tablet Take 81 mg by mouth daily.   Yes Historical Provider, MD  benazepril-hydrochlorthiazide (LOTENSIN HCT) 20-12.5 MG tablet Take 1 tablet by mouth daily. 09/26/16  Yes Historical Provider, MD  glipiZIDE (GLUCOTROL) 10 MG tablet Take 10 mg by mouth 2 (two) times daily. 09/04/16  Yes Historical Provider, MD  metFORMIN (GLUCOPHAGE) 1000 MG tablet Take 1,000 mg by mouth 2 (two) times daily. 09/22/16  Yes Historical Provider, MD  Methotrexate Sodium (METHOTREXATE, PF,) 50 MG/2ML injection Inject 6 mg into the muscle every Monday.  10/12/16  Yes Historical Provider, MD  naproxen sodium (ANAPROX) 220 MG tablet Take 440 mg by mouth daily.   Yes Historical Provider, MD  folic acid (FOLVITE) 1 MG tablet Take 1 mg by mouth daily.    Historical Provider, MD  predniSONE (DELTASONE) 5 MG tablet Take 5-30 mg by mouth daily with breakfast.    Historical Provider, MD    Physical Exam: Vitals:   10/23/16 1430 10/23/16 1500 10/23/16 1530 10/23/16 1600  BP: 125/69  (!) 146/73 (!) 132/58  Pulse:   68 87  Resp:      Temp:      TempSrc:      SpO2:   98% 97%  Weight:  126.1 kg (278 lb)    Height:   (1.727 m)  Constitutional: Obese male lying in bed comfortable, not in distress Vitals:   10/23/16 1430 10/23/16 1500 10/23/16 1530 10/23/16 1600  BP: 125/69  (!) 146/73 (!) 132/58  Pulse:   68 87  Resp:      Temp:      TempSrc:      SpO2:   98% 97%  Weight:  126.1 kg (278 lb)    Height:   (1.727 m)     Eyes: PERRL ENMT: Mucous membranes are dry  Neck: normal Respiratory: clear to auscultation bilaterally, no wheezing, no crackles. Normal  respiratory effort. No accessory muscle use.  Cardiovascular: Regular rate and rhythm, no murmurs / rubs / gallops. No extremity edema.  Abdomen: open blisters over the abdomen pannus and perianal area with pinkies discharge. Bowel sound positive, soft.  Musculoskeletal: no LE edema Skin: abdomen open wounds and blisters Neurologic:  Strength 5/5 in all 4. Alert, awake, following commands. Psychiatric: Normal judgment and insight.  Normal mood.    Labs on Admission: I have personally reviewed following labs and imaging studies  CBC:  Recent Labs Lab 10/23/16 1501  WBC 2.4*  NEUTROABS 1.8  HGB 12.2*  HCT 34.3*  MCV 95.5  PLT 96*   Basic Metabolic Panel:  Recent Labs Lab 10/23/16 1501  NA 128*  K 5.1  CL 90*  CO2 22  GLUCOSE 505*  BUN 34*  CREATININE 1.35*  CALCIUM 9.1   GFR: Estimated Creatinine Clearance: 67.8 mL/min (A) (by C-G formula based on SCr of 1.35 mg/dL (H)). Liver Function Tests:  Recent Labs Lab 10/23/16 1501  AST 38  ALT 45  ALKPHOS 70  BILITOT 1.4*  PROT 7.4  ALBUMIN 3.1*   No results for input(s): LIPASE, AMYLASE in the last 168 hours. No results for input(s): AMMONIA in the last 168 hours. Coagulation Profile: No results for input(s): INR, PROTIME in the last 168 hours. Cardiac Enzymes: No results for input(s): CKTOTAL, CKMB, CKMBINDEX, TROPONINI in the last 168 hours. BNP (last 3 results) No results for input(s): PROBNP in the last 8760 hours. HbA1C: No results for input(s): HGBA1C in the last 72 hours. CBG:  Recent Labs Lab 10/23/16 1449  GLUCAP 532*   Lipid Profile: No results for input(s): CHOL, HDL, LDLCALC, TRIG, CHOLHDL, LDLDIRECT in the last 72 hours. Thyroid Function Tests: No results for input(s): TSH, T4TOTAL, FREET4, T3FREE, THYROIDAB in the last 72 hours. Anemia Panel: No results for input(s): VITAMINB12, FOLATE, FERRITIN, TIBC, IRON, RETICCTPCT in the last 72 hours. Urine analysis:    Component Value Date/Time     COLORURINE YELLOW 08/28/2010 1042   APPEARANCEUR CLEAR 08/28/2010 1042   LABSPEC 1.009 08/28/2010 1042   PHURINE 7.5 08/28/2010 1042   GLUCOSEU NEGATIVE 07/10/2010 1103   GLUCOSEU NEGATIVE 05/27/2009 1135   HGBUR NEGATIVE 08/28/2010 1042   BILIRUBINUR NEGATIVE 08/28/2010 1042   KETONESUR NEGATIVE 08/28/2010 1042   PROTEINUR NEGATIVE 08/28/2010 1042   UROBILINOGEN 0.2 08/28/2010 1042   NITRITE NEGATIVE 08/28/2010 1042   LEUKOCYTESUR NEGATIVE 08/28/2010 1042   Sepsis Labs: !!!!!!!!!!!!!!!!!!!!!!!!!!!!!!!!!!!!!!!!!!!! (procalcitonin:4,lacticidven:4) )No results found for this or any previous visit (from the past 240 hour(s)).   Radiological Exams on Admission: No results found.    Assessment/Plan Active Problems:   Cellulitis  # Abdomen wall and peri-anal cellulitis, possibly superimposed fungal infection: -Patient is immunocompromised given weekly methotrexate, prednisone and history of diabetes. The blisters are open. Patient already received vancomycin and Zosyn and fluconazole in the ER. -I discussed with the ER regarding doing  blood culture. -Patient has mild elevation in lactic acid, I do not think patient is septic. -Continue broad spectrum antibiotics. May benefit from infectious disease consult in the morning. Wound care consult referred. Patient needs very close monitoring of wound and dressing change.  #Type 2 DM with hyperglycemia and possible mild diabetic ketoacidosis without comma. -Discussed with the ER team to start insulin. Pt has history of diabetes type 2 and takes oral medication at home. I will start Lantus 10 units at night and cover with medium dose sliding scale. Check A1c level. Continue IV fluid. Monitor blood sugar level. -diabetic diet  #Acute kidney injury in the setting of hyperglycemia, dehydration and NSAIDs use: Received IV fluid. I will check urinalysis. Monitor BMP. Avoid nephrotoxin.  #Hyponatremia likely in the setting of  hyperglycemia and hydrochlorothiazide: Repeat BMP in the morning. Correct hyperglycemia. Hold hydrochlorothiazide.  #History of psoriasis follows up with rheumatologist outpatient: Continue home dose of prednisone. Patient is not on distress or septic therefore will not order  stress dose of steroid. Patient takes weekly methotrexate.  #Leukopenia and thrombocytopenia likely contributed by methotrexate. No sign of bleeding. Monitor CBC.  DVT prophylaxis: Lovenox subcutaneous Code Status: Full code Family Communication: Discussed with the patient's sisters in ER Disposition Plan: Admit to inpatient likely discharge home in 2-3 days Consults called: None Admission status: Inpatient   Schneider Warchol Jaynie Collins MD [facility] Pager [phone]  If 7PM-7AM, please contact night-coverage [email or web address] Password [email or web address]  10/23/2016, 4:40 PM

## 2016-10-23 NOTE — Progress Notes (Addendum)
Pharmacy Antibiotic Note  Casey Gibson is a 71 y.o. diabetic  male admitted on 10/23/2016 with cellulitis of his pannus and oral thrush. Pharmacy has been consulted for vancomycin, zosyn, and oral fluconazole dosing. Renal function pending. Patient denied his listed PCN allergy.   Vancomycin trough goal 15-20  Plan: 1) Vancomycin  IV x 1 2) Zosyn 3.375g IV x 1 3) Fluconazole  PO x 1 4) Follow up sCr for further doses  ADDENDUM: SCr 1.35, CrCl 68 ml/min.   Plan: 1) Vancomycin 1g IV q12 2) Zosyn 3.375g IV q8 - 4 hour infusion 3) Fluconazole  PO q24  Height:  (172.7 cm) Weight: 278 lb (126.1 kg) IBW/kg (Calculated) : 68.4  Temp (24hrs), Avg:97.9 F (36.6 C), Min:97.9 F (36.6 C), Max:97.9 F (36.6 C)  No results for input(s): WBC, CREATININE, LATICACIDVEN, VANCOTROUGH, VANCOPEAK, VANCORANDOM, GENTTROUGH, GENTPEAK, GENTRANDOM, TOBRATROUGH, TOBRAPEAK, TOBRARND, AMIKACINPEAK, AMIKACINTROU, AMIKACIN in the last 168 hours.  CrCl cannot be calculated (Patient's most recent lab result is older than the maximum 21 days allowed.).    Allergies  Allergen Reactions  . Celecoxib   . Penicillins     Patient denied this allergy 10/23/16  . Simvastatin     REACTION: ankle swelling/fluid retention  . Sulfonamide Derivatives     Antimicrobials this admission: 4/6 Vancomycin >> 4/6 Zosyn >> 4/6 PO fluconazole >>  Dose adjustments this admission: n/a  Microbiology results: None yet  Thank you for allowing pharmacy to be a part of this patient's care.  Fredrik Rigger 10/23/2016 3:16 PM

## 2016-10-24 DIAGNOSIS — Z880 Allergy status to penicillin: Secondary | ICD-10-CM

## 2016-10-24 DIAGNOSIS — B370 Candidal stomatitis: Secondary | ICD-10-CM

## 2016-10-24 DIAGNOSIS — Z79899 Other long term (current) drug therapy: Secondary | ICD-10-CM

## 2016-10-24 DIAGNOSIS — Z882 Allergy status to sulfonamides status: Secondary | ICD-10-CM

## 2016-10-24 DIAGNOSIS — E669 Obesity, unspecified: Secondary | ICD-10-CM

## 2016-10-24 DIAGNOSIS — D61818 Other pancytopenia: Secondary | ICD-10-CM

## 2016-10-24 DIAGNOSIS — Z888 Allergy status to other drugs, medicaments and biological substances status: Secondary | ICD-10-CM

## 2016-10-24 DIAGNOSIS — E1169 Type 2 diabetes mellitus with other specified complication: Secondary | ICD-10-CM

## 2016-10-24 DIAGNOSIS — L8990 Pressure ulcer of unspecified site, unspecified stage: Secondary | ICD-10-CM | POA: Insufficient documentation

## 2016-10-24 LAB — BASIC METABOLIC PANEL
ANION GAP: 11 (ref 5–15)
BUN: 24 mg/dL — ABNORMAL HIGH (ref 6–20)
CALCIUM: 8.4 mg/dL — AB (ref 8.9–10.3)
CHLORIDE: 98 mmol/L — AB (ref 101–111)
CO2: 24 mmol/L (ref 22–32)
Creatinine, Ser: 1.02 mg/dL (ref 0.61–1.24)
GFR calc non Af Amer: >60 mL/min (ref >60)
GLUCOSE: 181 mg/dL — AB (ref 65–99)
Potassium: 3.4 mmol/L — ABNORMAL LOW (ref 3.5–5.1)
Sodium: 133 mmol/L — ABNORMAL LOW (ref 135–145)

## 2016-10-24 LAB — CBC
HEMATOCRIT: 29.4 % — AB (ref 39.0–52.0)
HEMOGLOBIN: 10.4 g/dL — AB (ref 13.0–17.0)
MCH: 34 pg (ref 26.0–34.0)
MCHC: 35.4 g/dL (ref 30.0–36.0)
MCV: 96.1 fL (ref 78.0–100.0)
Platelets: 59 10*3/uL — ABNORMAL LOW (ref 150–400)
RBC: 3.06 MIL/uL — ABNORMAL LOW (ref 4.22–5.81)
RDW: 13.9 % (ref 11.5–15.5)
WBC: 3.3 10*3/uL — AB (ref 4.0–10.5)

## 2016-10-24 LAB — GLUCOSE, CAPILLARY
GLUCOSE-CAPILLARY: 226 mg/dL — AB (ref 65–99)
GLUCOSE-CAPILLARY: 329 mg/dL — AB (ref 65–99)
GLUCOSE-CAPILLARY: 188 mg/dL — AB (ref 65–99)
Glucose-Capillary: 336 mg/dL — ABNORMAL HIGH (ref 65–99)

## 2016-10-24 LAB — HEMOGLOBIN A1C
Hgb A1c MFr Bld: 6.5 % — ABNORMAL HIGH (ref 4.8–5.6)
Mean Plasma Glucose: 140 mg/dL

## 2016-10-24 MED ORDER — CEFAZOLIN IN D5W 1 GM/50ML IV SOLN
1.0000 g | Freq: Three times a day (TID) | INTRAVENOUS | Status: DC
  Administered 2016-10-24 – 2016-10-27 (×8): 1 g via INTRAVENOUS
  Filled 2016-10-24 (×9): qty 50

## 2016-10-24 MED ORDER — INSULIN GLARGINE 100 UNIT/ML ~~LOC~~ SOLN
15.0000 [IU] | Freq: Every day | SUBCUTANEOUS | Status: DC
  Administered 2016-10-24 – 2016-10-27 (×4): 15 [IU] via SUBCUTANEOUS
  Filled 2016-10-24 (×6): qty 0.15

## 2016-10-24 MED ORDER — PREDNISONE 5 MG PO TABS
5.0000 mg | ORAL_TABLET | Freq: Every day | ORAL | Status: DC
  Administered 2016-10-25 – 2016-10-28 (×4): 5 mg via ORAL
  Filled 2016-10-24 (×4): qty 1

## 2016-10-24 NOTE — Progress Notes (Signed)
PROGRESS NOTE    Casey Gibson  WJX:[id number] DOB: July 03, 1946 DOA: 10/23/2016 PCP: [name], MD   Brief Narrative: 71 y.o. male with medical history significant of hypertension, obesity, psoriasis on prednisone and weekly methotrexate, diabetes mellitus presented to the hospital for the evaluation of worsening abdominal and sacral rash with discharge and pain.   Assessment & Plan:   Active Problems:   Cellulitis   Pressure injury of skin  # Abdomen wall and peri-anal cellulitis, possibly superimposed fungal infection: -Patient is immunocompromised because of methotrexate, prednisone and history of diabetes. The blisters are open and has drainage  -continue broad spectrum antibiotics vancomycin and Zosyn and fluconazole.  -follow up culture results.  -ID consult requested and discussed with [facility].  -Patient had mild elevation in lactic acid, I do not think patient is septic. -wound care consult.  -discussed about weight loss programs.   #Type 2 DM with hyperglycemia and possible mild diabetic ketoacidosis without coma. -Patient takes oral medication at home. Started Lantus and sliding scale in the hospital. Blood sugar is still elevated. I'll increase the dose of Lantus to 15 units. I'll defer to diabetic educator. Monitor blood sugar level. a1c 6.5  #Acute kidney injury in the setting of hyperglycemia, dehydration and NSAIDs use: UA unremarkable. Serum creatinine level already improved. I'll discontinue IV fluids since patient has good oral intake. Monitor BMP. Avoid nephrotoxins.   #Hyponatremia likely in the setting of hyperglycemia and hydrochlorothiazide: Serum sodium level improved to 133. Hyperglycemia improving. Monitor serum potassium level. Continue to hold hydrochlorothiazide.  #History of psoriasis follows up with rheumatologist outpatient: Continue home dose of prednisone. Patient is not on distress or septic therefore will not order  stress dose of steroid.  Patient takes weekly methotrexate.  #Leukopenia and thrombocytopenia likely contributed by methotrexate. No sign of bleeding. Monitor CBC. Platelet count dropped to 59 today.  DVT prophylaxis: SCD. Dc lovenox because of thrombocytopenia Code Status: Full code Family Communication: No family present at bedside Disposition Plan: Likely discharge home in 1-2 days    Consultants:   Infectious disease  Procedures: None Antimicrobials: IV vancomycin, Zosyn and fluconazole since 4/6  Subjective: Patient was seen and examined at bedside. Denied headache, dizziness, chest pain, shortness of breath, nausea vomiting or abdominal pain.  Objective: Vitals:   10/23/16 1829 10/23/16 2133 10/24/16 0529 10/24/16 1018  BP: (!) 159/71 (!) 117/43 (!) 121/57 (!) 119/56  Pulse: 67 71 72   Resp: Temp: 98.6 F (37 C) 98.4 F (36.9 C) 98.3 F (36.8 C)   TempSrc: Oral Oral Oral   SpO2: 93% 100% 99%   Weight: 124.7 kg (275 lb)     Height:  (1.727 m)       Intake/Output Summary (Last 24 hours) at 10/24/16 1154 Last data filed at 10/24/16 0708  Gross per 24 hour  Intake          3838.03 ml  Output              800 ml  Net          3038.03 ml   Filed Weights   10/23/16 1500 10/23/16 1829  Weight: 126.1 kg (278 lb) 124.7 kg (275 lb)    Examination:  General exam: Appears calm and comfortable  Respiratory system: Clear to auscultation. Respiratory effort normal. No wheezing or crackle Cardiovascular system: S1 & S2 heard, RRR.  No pedal edema. Gastrointestinal system: Abdomen pannus has open wound with no drainage today.,  Open wound around the perianal area. Central nervous system: Alert and oriented. No focal neurological deficits. Extremities: Symmetric 5 x 5 power. Skin: No rashes, lesions or ulcers Psychiatry: Judgement and insight appear normal. Mood & affect appropriate.     Data Reviewed: I have personally reviewed following labs and imaging  studies  CBC:  Recent Labs Lab 10/23/16 1501 10/24/16 0439  WBC 2.4* 3.3*  NEUTROABS 1.8  --   HGB 12.2* 10.4*  HCT 34.3* 29.4*  MCV 95.5 96.1  PLT 96* 59*   Basic Metabolic Panel:  Recent Labs Lab 10/23/16 1501 10/24/16 0439  NA 128* 133*  K 5.1 3.4*  CL 90* 98*  CO2 22 24  GLUCOSE 505* 181*  BUN 34* 24*  CREATININE 1.35* 1.02  CALCIUM 9.1 8.4*   GFR: Estimated Creatinine Clearance: 89.1 mL/min (by C-G formula based on SCr of 1.02 mg/dL). Liver Function Tests:  Recent Labs Lab 10/23/16 1501  AST 38  ALT 45  ALKPHOS 70  BILITOT 1.4*  PROT 7.4  ALBUMIN 3.1*   No results for input(s): LIPASE, AMYLASE in the last 168 hours. No results for input(s): AMMONIA in the last 168 hours. Coagulation Profile: No results for input(s): INR, PROTIME in the last 168 hours. Cardiac Enzymes: No results for input(s): CKTOTAL, CKMB, CKMBINDEX, TROPONINI in the last 168 hours. BNP (last 3 results) No results for input(s): PROBNP in the last 8760 hours. HbA1C:  Recent Labs  10/23/16 1945  HGBA1C 6.5*   CBG:  Recent Labs Lab 10/23/16 1659 10/23/16 1813 10/23/16 1832 10/23/16 2130 10/24/16 0744  GLUCAP 434* 254* 182* 253* 226*   Lipid Profile: No results for input(s): CHOL, HDL, LDLCALC, TRIG, CHOLHDL, LDLDIRECT in the last 72 hours. Thyroid Function Tests: No results for input(s): TSH, T4TOTAL, FREET4, T3FREE, THYROIDAB in the last 72 hours. Anemia Panel: No results for input(s): VITAMINB12, FOLATE, FERRITIN, TIBC, IRON, RETICCTPCT in the last 72 hours. Sepsis Labs:  Recent Labs Lab 10/23/16 1513  LATICACIDVEN 3.56*    Recent Results (from the past 240 hour(s))  Blood culture (routine x 2)     Status: None (Preliminary result)   Collection Time: 10/23/16  6:24 PM  Result Value Ref Range Status   Specimen Description BLOOD LEFT HAND  Final   Special Requests   Final    BOTTLES DRAWN AEROBIC ONLY Blood Culture results may not be optimal due to an  inadequate volume of blood received in culture bottles   Culture PENDING  Incomplete   Report Status PENDING  Incomplete         Radiology Studies: No results found.      Scheduled Meds: . amLODipine  10 mg Oral Daily  . aspirin EC  81 mg Oral Daily  . docusate sodium  100 mg Oral BID  . enoxaparin (LOVENOX) injection  40 mg Subcutaneous Q24H  . fluconazole  400 mg Oral Q24H  . insulin aspart  0-15 Units Subcutaneous TID WC  . insulin aspart  0-5 Units Subcutaneous QHS  . insulin glargine  10 Units Subcutaneous QHS  . piperacillin-tazobactam (ZOSYN)  IV  3.375 g Intravenous Q8H  . predniSONE  20 mg Oral Q breakfast  . vancomycin  1,000 mg Intravenous Q12H   Continuous Infusions: . sodium chloride 100 mL/hr at 10/24/16 0437     LOS: 1 day    Maksym Pfiffner Jaynie Collins, MD [facility] Pager [phone]  If 7PM-7AM, please contact night-coverage [email or web address] Password [facility] 10/24/2016, 11:54 AM

## 2016-10-24 NOTE — Progress Notes (Signed)
Casey Gibson [id number] Admission Data: 10/24/2016 7:54 AM Attending Provider: Maxie Barb, MD  WGN:[id number], Casey Custard, MD Consults/ Treatment Team:   Casey Gibson is a 71 y.o. male patient admitted from ED awake, alert  & orientated  X 3,  Full Code, VSS - Blood pressure (!) 121/57, pulse 72, temperature 98.3 F (36.8 C), temperature source Oral, resp. rate 18, height  (1.727 m), weight 124.7 kg (275 lb), SpO2 99 %., no c/o shortness of breath, no c/o chest pain, no distress noted.   IV site WDL:  hand right, condition patent and no redness and left, condition patent and no redness with a transparent dsg that's clean dry and intact.  Allergies:   Allergies  Allergen Reactions  . Simvastatin Other (See Comments)    REACTION: ankle swelling/fluid retention, myalgias  . Celecoxib Hives, Itching, Rash and Other (See Comments)  . Sulfonamide Derivatives Hives, Itching and Rash  . Penicillins Itching     Past Medical History:  Diagnosis Date  . Diabetes mellitus without complication (HCC)   . Hypertension   . Psoriasis     History:  obtained from ED Nurse. Tobacco/alcohol: denied none  Pt orientation to unit, room and routine. Information packet given to patient/family and safety video watched.  Admission INP armband ID verified with patient/family, and in place. SR up x 2, fall risk assessment complete with Patient and family verbalizing understanding of risks associated with falls. Pt verbalizes an understanding of how to use the call bell and to call for help before getting out of bed.  Skin, clean-dry- intact without evidence of bruising, or skin tears.   No evidence of skin break down noted on exam. excoriation - abdomen, groin, buttock(s) bilateral    Will cont to monitor and assist as needed.  Casey Flaming, RN 10/24/2016 7:54 AM

## 2016-10-24 NOTE — Consult Note (Signed)
[name]  Date of Admission:  10/23/2016  Date of Consult:  10/24/2016  Reason for Consult: Cellulitis Referring Physician: Carolin Sicks  Impression/Recommendation Cellulitis Would narrow anbx to ancef (stop zosyn/vanco) Continue diflucan for now Await BCx Watch his clinical exam  DM Much improved. His current A1C is < 7.  Suspect his recent hyperglycemia due to prednisone blast.   Psoriasis Could his oral lesions (and skin lesions) be due to methotrexate?  Thank you so much for this interesting consult,   Bobby Rumpf (pager) [phone] [email or web address]  Casey Gibson is an 71 y.o. male.  HPI: 71 yo M with hx of DM (> 40 yrs, last A1C 6.25% per pt) and psoriasis (on MTX and prednisone 59m daily) comes to hospital on 4-6 with worsening pruritis and pain in his skin lesions. He took an additional 3-4 tabs of prednisone to try to improve this but it did not help.  He came to ED with chills and was found to have GLc > 500. He was also found to have rash and d/c on his abd panus. He was started on vanco/zosyn/flucon.   Past Medical History:  Diagnosis Date  . Diabetes mellitus without complication ([facility]   . Hypertension   . Psoriasis     History reviewed. No pertinent surgical history.   Allergies  Allergen Reactions  . Simvastatin Other (See Comments)    REACTION: ankle swelling/fluid retention, myalgias  . Celecoxib Hives, Itching, Rash and Other (See Comments)  . Sulfonamide Derivatives Hives, Itching and Rash  . Penicillins Itching    Medications:  Scheduled: . amLODipine  10 mg Oral Daily  . aspirin EC  81 mg Oral Daily  . docusate sodium  100 mg Oral BID  . fluconazole  400 mg Oral Q24H  . insulin aspart  0-15 Units Subcutaneous TID WC  . insulin aspart  0-5 Units Subcutaneous QHS  . insulin glargine  15 Units Subcutaneous QHS  . piperacillin-tazobactam (ZOSYN)  IV  3.375 g Intravenous Q8H  . [START ON  10/25/2016] predniSONE  5 mg Oral Q breakfast  . vancomycin  1,000 mg Intravenous Q12H    Abtx:  Anti-infectives    Start     Dose/Rate Route Frequency Ordered Stop   10/24/16 1600  fluconazole (DIFLUCAN) tablet 400 mg     400 mg Oral Every 24 hours 10/23/16 1602     10/24/16 0400  vancomycin (VANCOCIN) IVPB 1000 mg/200 mL premix     1,000 mg 200 mL/hr over 60 Minutes Intravenous Every 12 hours 10/23/16 1602     10/24/16 0000  piperacillin-tazobactam (ZOSYN) IVPB 3.375 g     3.375 g 12.5 mL/hr over 240 Minutes Intravenous Every 8 hours 10/23/16 1602     10/23/16 1515  vancomycin (VANCOCIN) 2,500 mg in sodium chloride 0.9 % 500 mL IVPB     2,500 mg 250 mL/hr over 120 Minutes Intravenous  Once 10/23/16 1513 10/23/16 1804   10/23/16 1515  piperacillin-tazobactam (ZOSYN) IVPB 3.375 g     3.375 g 100 mL/hr over 30 Minutes Intravenous  Once 10/23/16 1513 10/23/16 1546   10/23/16 1515  fluconazole (DIFLUCAN) tablet 400 mg     400 mg Oral  Once 10/23/16 1513 10/23/16 1516      Total days of antibiotics: 1 vanco/zosyn/flucon          Social History:  reports that he has been smoking.  He has never used smokeless tobacco. He reports that he drinks  alcohol. He reports that he does not use drugs.  History reviewed. No pertinent family history.  General ROS: normal vision, anorexia, no BM, normal urination, denies numbness or sores in extremtities, denies oral ulcers, +dysphagia.  Please see HPI. 12 point ROS o/w (-)  Blood pressure 122/68, pulse (!) 58, temperature 98.7 F (37.1 C), temperature source Oral, resp. rate 16, height 5' 8"  (1.727 m), weight 124.7 kg (275 lb), SpO2 96 %. General appearance: alert, cooperative and no distress Eyes: negative findings: conjunctivae and sclerae normal and pupils equal, round, reactive to light and accomodation Throat: abnormal findings: erythema on tongue, no dc.  Neck: no adenopathy and supple, symmetrical, trachea midline Lungs: clear to  auscultation bilaterally Heart: regular rate and rhythm Abdomen: normal findings: bowel sounds normal and soft, non-tender Extremities: edema 3+ and no diabetic foot lesions. sensation grossly normal.  Skin: erythema and mild d/c in pannus/abd. small area of skin erosion on R.    Results for orders placed or performed during the hospital encounter of 10/23/16 (from the past 48 hour(s))  CBG monitoring, ED     Status: Abnormal   Collection Time: 10/23/16  2:49 PM  Result Value Ref Range   Glucose-Capillary 532 (HH) 65 - 99 mg/dL  Comprehensive metabolic panel     Status: Abnormal   Collection Time: 10/23/16  3:01 PM  Result Value Ref Range   Sodium 128 (L) 135 - 145 mmol/L   Potassium 5.1 3.5 - 5.1 mmol/L   Chloride 90 (L) 101 - 111 mmol/L   CO2 22 22 - 32 mmol/L   Glucose, Bld 505 (HH) 65 - 99 mg/dL    Comment: CRITICAL RESULT CALLED TO, READ BACK BY AND VERIFIED WITH: R RICHARDS,RN 1539 10/23/16 D BRADLEY    BUN 34 (H) 6 - 20 mg/dL   Creatinine, Ser 1.35 (H) 0.61 - 1.24 mg/dL   Calcium 9.1 8.9 - 10.3 mg/dL   Total Protein 7.4 6.5 - 8.1 g/dL   Albumin 3.1 (L) 3.5 - 5.0 g/dL   AST 38 15 - 41 U/L   ALT 45 17 - 63 U/L   Alkaline Phosphatase 70 38 - 126 U/L   Total Bilirubin 1.4 (H) 0.3 - 1.2 mg/dL   GFR calc non Af Amer 52 (L) >60 mL/min   GFR calc Af Amer >60 >60 mL/min    Comment: (NOTE) The eGFR has been calculated using the CKD EPI equation. This calculation has not been validated in all clinical situations. eGFR's persistently <60 mL/min signify possible Chronic Kidney Disease.    Anion gap 16 (H) 5 - 15  CBC with Differential     Status: Abnormal   Collection Time: 10/23/16  3:01 PM  Result Value Ref Range   WBC 2.4 (L) 4.0 - 10.5 K/uL   RBC 3.59 (L) 4.22 - 5.81 MIL/uL   Hemoglobin 12.2 (L) 13.0 - 17.0 g/dL   HCT 34.3 (L) 39.0 - 52.0 %   MCV 95.5 78.0 - 100.0 fL   MCH 34.0 26.0 - 34.0 pg   MCHC 35.6 30.0 - 36.0 g/dL   RDW 14.1 11.5 - 15.5 %   Platelets 96 (L) 150  - 400 K/uL    Comment: PLATELET COUNT CONFIRMED BY SMEAR   Neutrophils Relative % 76 %   Lymphocytes Relative 24 %   Monocytes Relative 0 %   Eosinophils Relative 0 %   Basophils Relative 0 %   Neutro Abs 1.8 1.7 - 7.7 K/uL   Lymphs Abs  0.6 (L) 0.7 - 4.0 K/uL   Monocytes Absolute 0.0 (L) 0.1 - 1.0 K/uL   Eosinophils Absolute 0.0 0.0 - 0.7 K/uL   Basophils Absolute 0.0 0.0 - 0.1 K/uL   WBC Morphology ATYPICAL LYMPHOCYTES   I-Stat CG4 Lactic Acid, ED     Status: Abnormal   Collection Time: 10/23/16  3:13 PM  Result Value Ref Range   Lactic Acid, Venous 3.56 (HH) 0.5 - 1.9 mmol/L   Comment NOTIFIED PHYSICIAN   CBG monitoring, ED     Status: Abnormal   Collection Time: 10/23/16  4:59 PM  Result Value Ref Range   Glucose-Capillary 434 (H) 65 - 99 mg/dL  Blood culture (routine x 2)     Status: None (Preliminary result)   Collection Time: 10/23/16  5:08 PM  Result Value Ref Range   Specimen Description BLOOD LEFT HAND    Special Requests      BOTTLES DRAWN AEROBIC ONLY Blood Culture adequate volume   Culture NO GROWTH < 24 HOURS    Report Status PENDING   CBG monitoring, ED     Status: Abnormal   Collection Time: 10/23/16  6:13 PM  Result Value Ref Range   Glucose-Capillary 254 (H) 65 - 99 mg/dL   Comment 1 Notify RN    Comment 2 Document in Chart   Blood culture (routine x 2)     Status: None (Preliminary result)   Collection Time: 10/23/16  6:24 PM  Result Value Ref Range   Specimen Description BLOOD LEFT HAND    Special Requests      BOTTLES DRAWN AEROBIC ONLY Blood Culture results may not be optimal due to an inadequate volume of blood received in culture bottles   Culture NO GROWTH < 24 HOURS    Report Status PENDING   Glucose, capillary     Status: Abnormal   Collection Time: 10/23/16  6:32 PM  Result Value Ref Range   Glucose-Capillary 182 (H) 65 - 99 mg/dL  Hemoglobin A1c     Status: Abnormal   Collection Time: 10/23/16  7:45 PM  Result Value Ref Range   Hgb A1c  MFr Bld 6.5 (H) 4.8 - 5.6 %    Comment: (NOTE)         Pre-diabetes: 5.7 - 6.4         Diabetes: >6.4         Glycemic control for adults with diabetes: <7.0    Mean Plasma Glucose 140 mg/dL    Comment: (NOTE) Performed At: [facility] [place], [place] [id number] Lindon Romp MD IW:[id number]   Glucose, capillary     Status: Abnormal   Collection Time: 10/23/16  9:30 PM  Result Value Ref Range   Glucose-Capillary 253 (H) 65 - 99 mg/dL  CBC     Status: Abnormal   Collection Time: 10/24/16  4:39 AM  Result Value Ref Range   WBC 3.3 (L) 4.0 - 10.5 K/uL   RBC 3.06 (L) 4.22 - 5.81 MIL/uL   Hemoglobin 10.4 (L) 13.0 - 17.0 g/dL   HCT 29.4 (L) 39.0 - 52.0 %   MCV 96.1 78.0 - 100.0 fL   MCH 34.0 26.0 - 34.0 pg   MCHC 35.4 30.0 - 36.0 g/dL   RDW 13.9 11.5 - 15.5 %   Platelets 59 (L) 150 - 400 K/uL    Comment: CONSISTENT WITH PREVIOUS RESULT  Basic metabolic panel     Status: Abnormal   Collection Time: 10/24/16  4:39 AM  Result Value Ref Range   Sodium 133 (L) 135 - 145 mmol/L   Potassium 3.4 (L) 3.5 - 5.1 mmol/L    Comment: NO VISIBLE HEMOLYSIS   Chloride 98 (L) 101 - 111 mmol/L   CO2 24 22 - 32 mmol/L   Glucose, Bld 181 (H) 65 - 99 mg/dL   BUN 24 (H) 6 - 20 mg/dL   Creatinine, Ser 1.02 0.61 - 1.24 mg/dL   Calcium 8.4 (L) 8.9 - 10.3 mg/dL   GFR calc non Af Amer >60 >60 mL/min   GFR calc Af Amer >60 >60 mL/min    Comment: (NOTE) The eGFR has been calculated using the CKD EPI equation. This calculation has not been validated in all clinical situations. eGFR's persistently <60 mL/min signify possible Chronic Kidney Disease.    Anion gap 11 5 - 15  Glucose, capillary     Status: Abnormal   Collection Time: 10/24/16  7:44 AM  Result Value Ref Range   Glucose-Capillary 226 (H) 65 - 99 mg/dL  Glucose, capillary     Status: Abnormal   Collection Time: 10/24/16 11:55 AM  Result Value Ref Range   Glucose-Capillary 329 (H) 65 - 99 mg/dL  Glucose,  capillary     Status: Abnormal   Collection Time: 10/24/16  5:08 PM  Result Value Ref Range   Glucose-Capillary 336 (H) 65 - 99 mg/dL      Component Value Date/Time   SDES BLOOD LEFT HAND 10/23/2016 1824   SPECREQUEST  10/23/2016 1824    BOTTLES DRAWN AEROBIC ONLY Blood Culture results may not be optimal due to an inadequate volume of blood received in culture bottles   CULT NO GROWTH < 24 HOURS 10/23/2016 1824   REPTSTATUS PENDING 10/23/2016 1824   No results found. Recent Results (from the past 240 hour(s))  Blood culture (routine x 2)     Status: None (Preliminary result)   Collection Time: 10/23/16  5:08 PM  Result Value Ref Range Status   Specimen Description BLOOD LEFT HAND  Final   Special Requests   Final    BOTTLES DRAWN AEROBIC ONLY Blood Culture adequate volume   Culture NO GROWTH < 24 HOURS  Final   Report Status PENDING  Incomplete  Blood culture (routine x 2)     Status: None (Preliminary result)   Collection Time: 10/23/16  6:24 PM  Result Value Ref Range Status   Specimen Description BLOOD LEFT HAND  Final   Special Requests   Final    BOTTLES DRAWN AEROBIC ONLY Blood Culture results may not be optimal due to an inadequate volume of blood received in culture bottles   Culture NO GROWTH < 24 HOURS  Final   Report Status PENDING  Incomplete      10/24/2016, 5:39 PM     LOS: 1 day    Records and images were personally reviewed where available.

## 2016-10-25 DIAGNOSIS — L03019 Cellulitis of unspecified finger: Secondary | ICD-10-CM

## 2016-10-25 DIAGNOSIS — L409 Psoriasis, unspecified: Secondary | ICD-10-CM

## 2016-10-25 LAB — BASIC METABOLIC PANEL
ANION GAP: 11 (ref 5–15)
BUN: 15 mg/dL (ref 6–20)
CALCIUM: 8.8 mg/dL — AB (ref 8.9–10.3)
CO2: 26 mmol/L (ref 22–32)
Chloride: 99 mmol/L — ABNORMAL LOW (ref 101–111)
Creatinine, Ser: 0.85 mg/dL (ref 0.61–1.24)
Glucose, Bld: 68 mg/dL (ref 65–99)
Potassium: 3.2 mmol/L — ABNORMAL LOW (ref 3.5–5.1)
SODIUM: 136 mmol/L (ref 135–145)

## 2016-10-25 LAB — GLUCOSE, CAPILLARY
GLUCOSE-CAPILLARY: 75 mg/dL (ref 65–99)
GLUCOSE-CAPILLARY: 182 mg/dL — AB (ref 65–99)
GLUCOSE-CAPILLARY: 202 mg/dL — AB (ref 65–99)
Glucose-Capillary: 128 mg/dL — ABNORMAL HIGH (ref 65–99)

## 2016-10-25 LAB — CBC
HCT: 27.8 % — ABNORMAL LOW (ref 39.0–52.0)
Hemoglobin: 9.9 g/dL — ABNORMAL LOW (ref 13.0–17.0)
MCH: 33.9 pg (ref 26.0–34.0)
MCHC: 35.6 g/dL (ref 30.0–36.0)
MCV: 95.2 fL (ref 78.0–100.0)
PLATELETS: 34 10*3/uL — AB (ref 150–400)
RBC: 2.92 MIL/uL — ABNORMAL LOW (ref 4.22–5.81)
RDW: 13.6 % (ref 11.5–15.5)
WBC: 4.1 10*3/uL (ref 4.0–10.5)

## 2016-10-25 MED ORDER — MAGIC MOUTHWASH W/LIDOCAINE
5.0000 mL | Freq: Three times a day (TID) | ORAL | Status: DC | PRN
  Administered 2016-10-26 – 2016-10-28 (×5): 5 mL via ORAL
  Filled 2016-10-25 (×6): qty 5

## 2016-10-25 MED ORDER — POTASSIUM CHLORIDE CRYS ER 20 MEQ PO TBCR
40.0000 meq | EXTENDED_RELEASE_TABLET | Freq: Once | ORAL | Status: AC
  Administered 2016-10-25: 40 meq via ORAL
  Filled 2016-10-25: qty 2

## 2016-10-25 NOTE — Progress Notes (Signed)
Inpatient Diabetes Program Recommendations  AACE/ADA: New Consensus Statement on Inpatient Glycemic Control (2015)  Target Ranges:  Prepandial:   less than 140 mg/dL      Peak postprandial:   less than 180 mg/dL (1-2 hours)      Critically ill patients:  140 - 180 mg/dL   Lab Results  Component Value Date   GLUCAP 128 (H) 10/25/2016   HGBA1C 6.5 (H) 10/23/2016    Review of Glycemic Control  Diabetes history: DM2 Outpatient Diabetes medications: metformin 1000 mg bid, glipizide 10 mg bid Current orders for Inpatient glycemic control: Lantus 15 units QHS, Novolog 0-15 units tidwc and hs  HgbA1C of 6.5% may not be accurate with low H/H. Prednisone tapered from 20 mg to 5 mg. Eating 100% and post-prandials ok today. Needs tight glycemic control for healing.  Inpatient Diabetes Program Recommendations:    Decrease Lantus to 12 units QHS  Needs to have glucose meter at home to check blood sugars. Needs to f/u with PCP or endo regarding his diabetes control. May need adjustment to home diabetes meds.  MD please write prescription for glucose meter and supplies at discharge. Diabetes Coordinator to see pt in am.  Thank you. Ailene Ards, RD, LDN, CDE Inpatient Diabetes Coordinator [phone]

## 2016-10-25 NOTE — Consult Note (Signed)
WOC Nurse wound consult note Reason for Consult: Moisture associated skin damage, specifically intertriginous dermatitis with partial and full thickness skin loss in the subpannicular region, the bilateral inguinal regions, the right scrotal area and the intergluteal cleft. Patient reports he has had an "outbreak" of this type one other time in the past, approximately 4-5 years ago. He does not recall what exacerbated or relieved the lesions at that time. Wound type: Moisture Associated Skin Damage (MASD), specifically intertriginous dermatitis (ITD), severe Pressure Injury POA: No.  Areas are present on admission, but not related to pressure Measurement: 1. Area at the pannicular skin fold measures 5cm x 8cm x 0.1cm with red, moist wound bed and a moderate amount of musty-smelling light yellow exudate. 2.  Right scrotal full thickness wound measures 4cm x 5cm x 0.1cm with idential presentation to pannicular skin fold wound 3.  Bilateral inguinal wounds are partial thickness, linear and measure 7cm x 0.2cm x 0.1cm with pink wound bed and scant serous drainage.  4. Intragluteal cleft with right intertriginous full thickness wound measuring 3cm x 4cm x 0.2cm.  Small circular wounds proximal to the right gluteal cleft wound measure 0.5cm round x 0.2cm.  These are not draining, but patient reports are painful. Wound bed:AS described above Drainage (amount, consistency, odor) As described above Periwound: All wounds present with periwound maceration, grey/white tissue. Dressing procedure/placement/frequency: POC will include provision of a mattress replacement with low air loss feature and turning and repositioning to avoid the supine position.  Nursing has been provided with guidance to place pillows between the knees/thighs to promote airflow to the inguinal and right scrotal areas of full thickness skin loss (intertriginous dermatitis).  Topical care for the subpannicular, intragluteal, right scrotal and  bilateral inguinal areas of skin loss due to intertriginous dermatitis (ITD), a form of moisture associated skin damage (MASD) will be twice daily cleansing with NS followed by gentle, but thorough pat drying.  This will be followed by placement of our house antimicrobial textile, [employer] Ag+ which donates antimicrobial silver while simultaneously wicking away moisture. (Instructions for use are provided for Nursing via the Orders.) The hospitalist/pharmacy/ID team have previously addressed the need for systemic antifungal (I.e., Diflucan) and I am in accordance with their plan. Circular lesions in the intragluteal region could also be viral; I will leave the definitive differential diagnosis to my medical colleagues who may feel a tissue biopsy is in order. WOC nursing team will not follow, but will remain available to this patient, the nursing and medical teams.  Please re-consult if needed. Thanks, Ladona Mow, MSN, RN, GNP, [facility]  Pager# [phone]

## 2016-10-25 NOTE — Progress Notes (Signed)
INFECTIOUS DISEASE PROGRESS NOTE  ID: Casey Gibson is a 71 y.o. male with  Active Problems:   Cellulitis   Pressure injury of skin   Pancytopenia (HCC)   Thrush  Subjective: c/o pain from wounds, dressing changes.   Abtx:  Anti-infectives    Start     Dose/Rate Route Frequency Ordered Stop   10/24/16 2200  ceFAZolin (ANCEF) IVPB 1 g/50 mL premix     1 g 100 mL/hr over 30 Minutes Intravenous Every 8 hours 10/24/16 1751     10/24/16 1600  fluconazole (DIFLUCAN) tablet 400 mg     400 mg Oral Every 24 hours 10/23/16 1602     10/24/16 0400  vancomycin (VANCOCIN) IVPB 1000 mg/200 mL premix  Status:  Discontinued     1,000 mg 200 mL/hr over 60 Minutes Intravenous Every 12 hours 10/23/16 1602 10/24/16 1751   10/24/16 0000  piperacillin-tazobactam (ZOSYN) IVPB 3.375 g  Status:  Discontinued     3.375 g 12.5 mL/hr over 240 Minutes Intravenous Every 8 hours 10/23/16 1602 10/24/16 1751   10/23/16 1515  vancomycin (VANCOCIN) 2,500 mg in sodium chloride 0.9 % 500 mL IVPB     2,500 mg 250 mL/hr over 120 Minutes Intravenous  Once 10/23/16 1513 10/23/16 1804   10/23/16 1515  piperacillin-tazobactam (ZOSYN) IVPB 3.375 g     3.375 g 100 mL/hr over 30 Minutes Intravenous  Once 10/23/16 1513 10/23/16 1546   10/23/16 1515  fluconazole (DIFLUCAN) tablet 400 mg     400 mg Oral  Once 10/23/16 1513 10/23/16 1516      Medications:  Scheduled: . amLODipine  10 mg Oral Daily  . aspirin EC  81 mg Oral Daily  .  ceFAZolin (ANCEF) IV  1 g Intravenous Q8H  . docusate sodium  100 mg Oral BID  . fluconazole  400 mg Oral Q24H  . insulin aspart  0-15 Units Subcutaneous TID WC  . insulin aspart  0-5 Units Subcutaneous QHS  . insulin glargine  15 Units Subcutaneous QHS  . potassium chloride  40 mEq Oral Once  . predniSONE  5 mg Oral Q breakfast    Objective: Vital signs in last 24 hours: Temp:  [98.3 F (36.8 C)-98.7 F (37.1 C)] 98.3 F (36.8 C) (04/08 0522) Pulse Rate:  [58-79] 79 (04/08  0522) Resp:  [16-18] 18 (04/08 0522) BP: (122-133)/(68-77) 132/71 (04/08 0522) SpO2:  [96 %-99 %] 99 % (04/08 0522)   General appearance: alert, cooperative, mild distress and morbidly obese Resp: clear to auscultation bilaterally Cardio: regular rate and rhythm GI: normal findings: bowel sounds normal and soft, non-tender and abnormal findings:  pannus wound is dressed.  Incision/Wound: groin wounds are erythematous ulcers, very tender.    Lab Results  Recent Labs  10/24/16 0439 10/25/16 0443  WBC 3.3* 4.1  HGB 10.4* 9.9*  HCT 29.4* 27.8*  NA 133* 136  K 3.4* 3.2*  CL 98* 99*  CO2 24 26  BUN 24* 15  CREATININE 1.02 0.85   Liver Panel  Recent Labs  10/23/16 1501  PROT 7.4  ALBUMIN 3.1*  AST 38  ALT 45  ALKPHOS 70  BILITOT 1.4*   Sedimentation Rate No results for input(s): ESRSEDRATE in the last 72 hours. C-Reactive Protein No results for input(s): CRP in the last 72 hours.  Microbiology: Recent Results (from the past 240 hour(s))  Blood culture (routine x 2)     Status: None (Preliminary result)   Collection Time: 10/23/16  5:08 PM  Result Value Ref Range Status   Specimen Description BLOOD LEFT HAND  Final   Special Requests   Final    BOTTLES DRAWN AEROBIC ONLY Blood Culture adequate volume   Culture NO GROWTH < 24 HOURS  Final   Report Status PENDING  Incomplete  Blood culture (routine x 2)     Status: None (Preliminary result)   Collection Time: 10/23/16  6:24 PM  Result Value Ref Range Status   Specimen Description BLOOD LEFT HAND  Final   Special Requests   Final    BOTTLES DRAWN AEROBIC ONLY Blood Culture results may not be optimal due to an inadequate volume of blood received in culture bottles   Culture NO GROWTH < 24 HOURS  Final   Report Status PENDING  Incomplete    Studies/Results: No results found.   Assessment/Plan: Cellulitis Continue diflucan and ancef for now Await BCx ngtd Watch his clinical exam- suggested non-adherent  dressings to nursing WOC eval/ follow up  DM FSG erratic.  His current A1C is < 7.   Psoriasis Could his oral lesions (and skin lesions) be due to methotrexate?  Total days of antibiotics: 2 diflucan/ancef         [name] Infectious Diseases (pager) [phone] [email or web address] 10/25/2016, 12:34 PM  LOS: 2 days

## 2016-10-25 NOTE — Progress Notes (Addendum)
PROGRESS NOTE    Casey Gibson  WUJ:[id number] DOB: Apr 04, 1946 DOA: 10/23/2016 PCP: [name], MD   Brief Narrative: 71 y.o. male with medical history significant of hypertension, obesity, psoriasis on prednisone and weekly methotrexate, diabetes mellitus presented to the hospital for the evaluation of worsening abdominal and sacral rash with discharge and pain.   Assessment & Plan:   Active Problems:   Cellulitis   Pressure injury of skin   Pancytopenia (HCC)   Thrush  # Abdomen wall and peri-anal cellulitis, possibly superimposed fungal infection: Patient is immunocompromised because of methotrexate, prednisone and history of diabetes. The blisters are open and has drainage Broad-spectrum antibiotics discontinued, patient switched to Ancef, continue fluconazole -follow up culture results.  -Appreciate ID input, Dr. Ninetta Lights.  -wound care consult.  -discussed about weight loss programs.  Follow blood cultures from 4/6  #Type 2 DM with hyperglycemia and possible mild diabetic ketoacidosis without coma. -Patient takes oral medication at home. Started Lantus and sliding scale in the hospital. Blood sugar is still elevated. Continue Lantus to 15 units.  . Monitor blood sugar level. a1c 6.5.   #Acute kidney injury in the setting of hyperglycemia, dehydration and NSAIDs use: UA unremarkable. Serum creatinine level already improved. I'll discontinue IV fluids since patient has good oral intake. Monitor BMP. Avoid nephrotoxins.   #Hyponatremia likely in the setting of hyperglycemia and hydrochlorothiazide: Serum sodium level improved to 133 >136. Hyperglycemia improving. Monitor serum potassium level. Continue to hold hydrochlorothiazide.  #History of psoriasis follows up with rheumatologist outpatient: Continue home dose of prednisone. Patient is not in  distress or septic therefore will not order  stress dose of steroid. Patient takes weekly methotrexate.  #Leukopenia and  thrombocytopenia likely contributed by methotrexate. No sign of bleeding. Monitor CBC. Platelet count dropped to 59 today.  #Hypokalemia-replete  # Anemia of chronic disease /ABLA  Baseline 12.2, slow oozing , hg now 9.9, will recheck in am    DVT prophylaxis: SCD. Dc lovenox because of thrombocytopenia Code Status: Full code Family Communication: No family present at bedside Disposition Plan: Likely discharge home in 1-2 days    Consultants:   Infectious disease  Procedures: None Antimicrobials: IV vancomycin, Zosyn and fluconazole since 4/6 Cefazolin  Subjective: Patient was seen and examined at bedside. Oozing blood from perineal area  Objective: Vitals:   10/24/16 1018 10/24/16 1618 10/24/16 2156 10/25/16 0522  BP: (!) 119/56 122/68 133/77 132/71  Pulse:  (!) 58 76 79  Resp:  Temp:  98.7 F (37.1 C) 98.6 F (37 C) 98.3 F (36.8 C)  TempSrc:  Oral Oral Oral  SpO2:  96% 97% 99%  Weight:      Height:        Intake/Output Summary (Last 24 hours) at 10/25/16 1012 Last data filed at 10/25/16 0900  Gross per 24 hour  Intake              730 ml  Output             2050 ml  Net            -1320 ml   Filed Weights   10/23/16 1500 10/23/16 1829  Weight: 126.1 kg (278 lb) 124.7 kg (275 lb)    Examination:  General exam: Appears calm and comfortable  Respiratory system: Clear to auscultation. Respiratory effort normal. No wheezing or crackle Cardiovascular system: S1 & S2 heard, RRR.  No pedal edema. Gastrointestinal system: Abdomen pannus has open wound  with no drainage today., Open wound around the perianal area. Central nervous system: Alert and oriented. No focal neurological deficits. Extremities: Symmetric 5 x 5 power. Skin:  dermatitis with partial and full thickness skin loss in the subpannicular region, the bilateral inguinal regions, the right scrotal area and the intergluteal cleft Psychiatry: Judgement and insight appear normal. Mood &  affect appropriate.     Data Reviewed: I have personally reviewed following labs and imaging studies  CBC:  Recent Labs Lab 10/23/16 1501 10/24/16 0439 10/25/16 0443  WBC 2.4* 3.3* 4.1  NEUTROABS 1.8  --   --   HGB 12.2* 10.4* 9.9*  HCT 34.3* 29.4* 27.8*  MCV 95.5 96.1 95.2  PLT 96* 59* 34*   Basic Metabolic Panel:  Recent Labs Lab 10/23/16 1501 10/24/16 0439 10/25/16 0443  NA 128* 133* 136  K 5.1 3.4* 3.2*  CL 90* 98* 99*  CO2 GLUCOSE 505* 181* 68  BUN 34* 24* 15  CREATININE 1.35* 1.02 0.85  CALCIUM 9.1 8.4* 8.8*   GFR: Estimated Creatinine Clearance: 106.9 mL/min (by C-G formula based on SCr of 0.85 mg/dL). Liver Function Tests:  Recent Labs Lab 10/23/16 1501  AST 38  ALT 45  ALKPHOS 70  BILITOT 1.4*  PROT 7.4  ALBUMIN 3.1*   No results for input(s): LIPASE, AMYLASE in the last 168 hours. No results for input(s): AMMONIA in the last 168 hours. Coagulation Profile: No results for input(s): INR, PROTIME in the last 168 hours. Cardiac Enzymes: No results for input(s): CKTOTAL, CKMB, CKMBINDEX, TROPONINI in the last 168 hours. BNP (last 3 results) No results for input(s): PROBNP in the last 8760 hours. HbA1C:  Recent Labs  10/23/16 1945  HGBA1C 6.5*   CBG:  Recent Labs Lab 10/24/16 0744 10/24/16 1155 10/24/16 1708 10/24/16 2158 10/25/16 0759  GLUCAP 226* 329* 336* 188* 75   Lipid Profile: No results for input(s): CHOL, HDL, LDLCALC, TRIG, CHOLHDL, LDLDIRECT in the last 72 hours. Thyroid Function Tests: No results for input(s): TSH, T4TOTAL, FREET4, T3FREE, THYROIDAB in the last 72 hours. Anemia Panel: No results for input(s): VITAMINB12, FOLATE, FERRITIN, TIBC, IRON, RETICCTPCT in the last 72 hours. Sepsis Labs:  Recent Labs Lab 10/23/16 1513  LATICACIDVEN 3.56*    Recent Results (from the past 240 hour(s))  Blood culture (routine x 2)     Status: None (Preliminary result)   Collection Time: 10/23/16  5:08 PM  Result  Value Ref Range Status   Specimen Description BLOOD LEFT HAND  Final   Special Requests   Final    BOTTLES DRAWN AEROBIC ONLY Blood Culture adequate volume   Culture NO GROWTH < 24 HOURS  Final   Report Status PENDING  Incomplete  Blood culture (routine x 2)     Status: None (Preliminary result)   Collection Time: 10/23/16  6:24 PM  Result Value Ref Range Status   Specimen Description BLOOD LEFT HAND  Final   Special Requests   Final    BOTTLES DRAWN AEROBIC ONLY Blood Culture results may not be optimal due to an inadequate volume of blood received in culture bottles   Culture NO GROWTH < 24 HOURS  Final   Report Status PENDING  Incomplete         Radiology Studies: No results found.      Scheduled Meds: . amLODipine  10 mg Oral Daily  . aspirin EC  81 mg Oral Daily  .  ceFAZolin (ANCEF) IV  1 g  Intravenous Q8H  . docusate sodium  100 mg Oral BID  . fluconazole  400 mg Oral Q24H  . insulin aspart  0-15 Units Subcutaneous TID WC  . insulin aspart  0-5 Units Subcutaneous QHS  . insulin glargine  15 Units Subcutaneous QHS  . predniSONE  5 mg Oral Q breakfast   Continuous Infusions:    LOS: 2 days    Richarda Overlie, MD [facility] Pager [phone]  If 7PM-7AM, please contact night-coverage [email or web address] Password [email or web address] 10/25/2016, 10:12 AM

## 2016-10-26 DIAGNOSIS — L089 Local infection of the skin and subcutaneous tissue, unspecified: Secondary | ICD-10-CM

## 2016-10-26 DIAGNOSIS — Z7952 Long term (current) use of systemic steroids: Secondary | ICD-10-CM

## 2016-10-26 DIAGNOSIS — L409 Psoriasis, unspecified: Secondary | ICD-10-CM

## 2016-10-26 DIAGNOSIS — D696 Thrombocytopenia, unspecified: Secondary | ICD-10-CM

## 2016-10-26 DIAGNOSIS — E119 Type 2 diabetes mellitus without complications: Secondary | ICD-10-CM

## 2016-10-26 DIAGNOSIS — E8729 Other acidosis: Secondary | ICD-10-CM

## 2016-10-26 DIAGNOSIS — F1020 Alcohol dependence, uncomplicated: Secondary | ICD-10-CM

## 2016-10-26 DIAGNOSIS — L98499 Non-pressure chronic ulcer of skin of other sites with unspecified severity: Secondary | ICD-10-CM

## 2016-10-26 DIAGNOSIS — E872 Acidosis: Secondary | ICD-10-CM

## 2016-10-26 DIAGNOSIS — B9689 Other specified bacterial agents as the cause of diseases classified elsewhere: Secondary | ICD-10-CM

## 2016-10-26 DIAGNOSIS — L03311 Cellulitis of abdominal wall: Secondary | ICD-10-CM

## 2016-10-26 LAB — LACTATE DEHYDROGENASE: LDH: 166 U/L (ref 98–192)

## 2016-10-26 LAB — GLUCOSE, CAPILLARY
GLUCOSE-CAPILLARY: 99 mg/dL (ref 65–99)
Glucose-Capillary: 163 mg/dL — ABNORMAL HIGH (ref 65–99)
Glucose-Capillary: 190 mg/dL — ABNORMAL HIGH (ref 65–99)
Glucose-Capillary: 108 mg/dL — ABNORMAL HIGH (ref 65–99)

## 2016-10-26 LAB — COMPREHENSIVE METABOLIC PANEL
ALT: 62 U/L (ref 17–63)
ANION GAP: 10 (ref 5–15)
AST: 61 U/L — ABNORMAL HIGH (ref 15–41)
Albumin: 2.7 g/dL — ABNORMAL LOW (ref 3.5–5.0)
Alkaline Phosphatase: 62 U/L (ref 38–126)
BILIRUBIN TOTAL: 0.6 mg/dL (ref 0.3–1.2)
BUN: 9 mg/dL (ref 6–20)
CHLORIDE: 102 mmol/L (ref 101–111)
CO2: 24 mmol/L (ref 22–32)
Calcium: 8.9 mg/dL (ref 8.9–10.3)
Creatinine, Ser: 0.71 mg/dL (ref 0.61–1.24)
Glucose, Bld: 106 mg/dL — ABNORMAL HIGH (ref 65–99)
POTASSIUM: 3.6 mmol/L (ref 3.5–5.1)
Sodium: 136 mmol/L (ref 135–145)
TOTAL PROTEIN: 6.4 g/dL — AB (ref 6.5–8.1)

## 2016-10-26 LAB — CBC
HEMATOCRIT: 29.3 % — AB (ref 39.0–52.0)
HEMOGLOBIN: 10.4 g/dL — AB (ref 13.0–17.0)
MCH: 33.9 pg (ref 26.0–34.0)
MCHC: 35.5 g/dL (ref 30.0–36.0)
MCV: 95.4 fL (ref 78.0–100.0)
Platelets: 21 10*3/uL — CL (ref 150–400)
RBC: 3.07 MIL/uL — AB (ref 4.22–5.81)
RDW: 13.8 % (ref 11.5–15.5)
WBC: 3.5 10*3/uL — AB (ref 4.0–10.5)

## 2016-10-26 LAB — TYPE AND SCREEN
ABO/RH(D): B POS
ANTIBODY SCREEN: NEGATIVE

## 2016-10-26 LAB — SAVE SMEAR

## 2016-10-26 LAB — PROTIME-INR
INR: 1.12
Prothrombin Time: 14.5 seconds (ref 11.4–15.2)

## 2016-10-26 MED ORDER — SODIUM CHLORIDE 0.9 % IV SOLN: Freq: Once | INTRAVENOUS | Status: DC

## 2016-10-26 NOTE — Progress Notes (Signed)
Platelet infusion held per MD request.

## 2016-10-26 NOTE — Progress Notes (Signed)
Call placed to Dr. Joycelyn Das office to make inquiries for hematology consult.

## 2016-10-26 NOTE — Progress Notes (Signed)
Subjective:  He is complaining of pain with changing any of his dressings   Antibiotics:  Anti-infectives    Start     Dose/Rate Route Frequency Ordered Stop   10/24/16 2200  ceFAZolin (ANCEF) IVPB 1 g/50 mL premix     1 g 100 mL/hr over 30 Minutes Intravenous Every 8 hours 10/24/16 1751     10/24/16 1600  fluconazole (DIFLUCAN) tablet 400 mg     400 mg Oral Every 24 hours 10/23/16 1602     10/24/16 0400  vancomycin (VANCOCIN) IVPB 1000 mg/200 mL premix  Status:  Discontinued     1,000 mg 200 mL/hr over 60 Minutes Intravenous Every 12 hours 10/23/16 1602 10/24/16 1751   10/24/16 0000  piperacillin-tazobactam (ZOSYN) IVPB 3.375 g  Status:  Discontinued     3.375 g 12.5 mL/hr over 240 Minutes Intravenous Every 8 hours 10/23/16 1602 10/24/16 1751   10/23/16 1515  vancomycin (VANCOCIN) 2,500 mg in sodium chloride 0.9 % 500 mL IVPB     2,500 mg 250 mL/hr over 120 Minutes Intravenous  Once 10/23/16 1513 10/23/16 1804   10/23/16 1515  piperacillin-tazobactam (ZOSYN) IVPB 3.375 g     3.375 g 100 mL/hr over 30 Minutes Intravenous  Once 10/23/16 1513 10/23/16 1546   10/23/16 1515  fluconazole (DIFLUCAN) tablet 400 mg     400 mg Oral  Once 10/23/16 1513 10/23/16 1516      Medications: Scheduled Meds: . sodium chloride   Intravenous Once  . amLODipine  10 mg Oral Daily  .  ceFAZolin (ANCEF) IV  1 g Intravenous Q8H  . docusate sodium  100 mg Oral BID  . fluconazole  400 mg Oral Q24H  . insulin aspart  0-15 Units Subcutaneous TID WC  . insulin aspart  0-5 Units Subcutaneous QHS  . insulin glargine  15 Units Subcutaneous QHS  . predniSONE  5 mg Oral Q breakfast   Continuous Infusions: PRN Meds:.acetaminophen **OR** acetaminophen, magic mouthwash w/lidocaine, ondansetron **OR** ondansetron (ZOFRAN) IV, oxyCODONE    Objective: Weight change:   Intake/Output Summary (Last 24 hours) at 10/26/16 1227 Last data filed at 10/26/16 0523  Gross per 24 hour  Intake               700 ml  Output              950 ml  Net             -250 ml   Blood pressure 131/66, pulse 77, temperature 98.5 F (36.9 C), temperature source Oral, resp. rate 18, height  (1.727 m), weight 275 lb (124.7 kg), SpO2 100 %. Temp:  [98 F (36.7 C)-98.5 F (36.9 C)] 98.5 F (36.9 C) (04/09 0503) Pulse Rate:  [72-77] 77 (04/09 0503) Resp:  [16-18] 18 (04/09 0503) BP: (130-131)/(66-73) 131/66 (04/09 0503) SpO2:  [100 %] 100 % (04/09 0503)  Physical Exam: General: Alert and awake, oriented x3, not in any acute distress, morbidly obese HEENT: anicteric sclera,, EOMI CVS regular rate, normal r,  no murmur rubs or gallops Chest: clear to auscultation bilaterally, no wheezing, rales or rhonchi Abdomen: soft nondistended, normal bowel sounds, Extremities, skin:  Difficult to examine his wounds due to his pain with moving soft tissue about he seems to have some bleeding going on and also question of a blood clot that I'm seeing at near his scrotum that may have moved there from the pannus wound  10/26/5016: pannus with  dressing    Blood clot?  near scrotum. He would not let me try to remove this    Has some circular lesions on his legs which she says are due to his psoriasis.   Neuro: nonfocal  CBC:  CBC Latest Ref Rng & Units 10/26/2016 10/25/2016 10/24/2016  WBC 4.0 - 10.5 K/uL 3.5(L) 4.1 3.3(L)  Hemoglobin 13.0 - 17.0 g/dL 10.4(L) 9.9(L) 10.4(L)  Hematocrit 39.0 - 52.0 % 29.3(L) 27.8(L) 29.4(L)  Platelets 150 - 400 K/uL 21(LL) 34(L) 59(L)      BMET  Recent Labs  10/25/16 0443 10/26/16 0553  NA 136 136  K 3.2* 3.6  CL 99* 102  CO2 26 24  GLUCOSE 68 106*  BUN 15 9  CREATININE 0.85 0.71  CALCIUM 8.8* 8.9     Liver Panel   Recent Labs  10/23/16 1501 10/26/16 0553  PROT 7.4 6.4*  ALBUMIN 3.1* 2.7*  AST 38 61*  ALT 45 62  ALKPHOS 70 62  BILITOT 1.4* 0.6       Sedimentation Rate No results for input(s): ESRSEDRATE in the last 72 hours. C-Reactive  Protein No results for input(s): CRP in the last 72 hours.  Micro Results: Recent Results (from the past 720 hour(s))  Blood culture (routine x 2)     Status: None (Preliminary result)   Collection Time: 10/23/16  5:08 PM  Result Value Ref Range Status   Specimen Description BLOOD LEFT HAND  Final   Special Requests   Final    BOTTLES DRAWN AEROBIC ONLY Blood Culture adequate volume   Culture NO GROWTH 2 DAYS  Final   Report Status PENDING  Incomplete  Blood culture (routine x 2)     Status: None (Preliminary result)   Collection Time: 10/23/16  6:24 PM  Result Value Ref Range Status   Specimen Description BLOOD LEFT HAND  Final   Special Requests   Final    BOTTLES DRAWN AEROBIC ONLY Blood Culture results may not be optimal due to an inadequate volume of blood received in culture bottles   Culture NO GROWTH 2 DAYS  Final   Report Status PENDING  Incomplete    Studies/Results: No results found.    Assessment/Plan:  INTERVAL HISTORY: still with signficant wounds and bleeding, thrombocytopenia   Active Problems:   Cellulitis   Pressure injury of skin   Pancytopenia (HCC)   Thrush   Psoriasis   Metabolic acidosis, increased anion gap (IAG)    Casey Gibson is a 71 y.o. male with psoriasis on MTX and prednisone with multiple ulcers and skin lesions on fluconazole and cefazolin. He had pancytopenia on admission with worsening of his thrombocytopenia in the interim.  #1 Ulcers with possible superinfection;  Ok to continue azole and cefazolin  Continue wound care but ? Should be transferred to [facility] with Dermatology, Rheumatology and WOC  Agree could consider biopsy his rash  #2 Pancytopenia: Cannot find any records from any hospitals that use care everywhere in the vicinity. I would recommend getting records from his primary care physician and his rheumatologist. Again as per above would strongly consider transfer to a center that has rheumatology and  dermatology expertise which we lack.   LOS: 3 days   Casey Gibson 10/26/2016, 12:27 PM

## 2016-10-26 NOTE — Consult Note (Addendum)
Referral MD  Reason for Referral: Progressive thrombocytopenia; psoriasis; significant alcohol use   Chief Complaint  Patient presents with  . Rash  : I have psoriasis that is getting worse.  HPI: Casey Gibson is a very nice 71 year old African-American male. He follows with [facility].  He has a long history of diabetes. He is not on insulin. He has a long history of psoriasis.  He was admitted on April 6. He had an open abdominal wound.  He is on weekly methotrexate. He also is on prednisone.  He is morbidly obese. He has extensive pannus development.  He does drink alcohol. It sounds like he drinks more heavily than what has been reported. He says he likes gin.  On admission, his platelet count was only 96,000. His hemoglobin was 12.2 and white cell count was 2.4. The last laboratory data have on him was back in 2012 when he had back surgery. His platelet count was 406,000. His white cell count was 9.  On admission, cultures were taken. These are been negative.  He is was started on antibiotics. Infectious disease has been following him. He assumed was placed on Zosyn and vancomycin. He now is on Ancef.  He has had some bleeding from the scrotal area.  His platelet count has dropped quickly. The following day, his platelet was 59,000. Today, his platelet count is 21,000. His LDH is 166. His renal function is okay. When he came in his creatinine is 1.35. With hydration, the creatinine was down to 0.7. Her weight came in, his glucose was 505. Today, it is 106.  He's never been transfused. He does not give any obvious risk factors for HIV or hepatitis.  He's had no fever.  We are asked to see him because of the thrombocytopenia.  As far as I can tell, he has not been getting any heparin or Lovenox. I'm not sure what kind of DVT prophylaxis he got when he was admitted.  Again, he is quite obese. I'm unsure how active he really is.  Overall, I would say his performance  status is ECOG 2.    Past Medical History:  Diagnosis Date  . Diabetes mellitus without complication ([place])   . Hypertension   . Psoriasis   :  History reviewed. No pertinent surgical history.:   Current Facility-Administered Medications:  .  0.9 %  sodium chloride infusion, , Intravenous, Once, Reyne Dumas, MD .  acetaminophen (TYLENOL) tablet 650 mg, 650 mg, Oral, Q6H PRN **OR** acetaminophen (TYLENOL) suppository 650 mg, 650 mg, Rectal, Q6H PRN, Dron Tanna Furry, MD .  amLODipine (NORVASC) tablet 10 mg, 10 mg, Oral, Daily, Dron Tanna Furry, MD, 10 mg at 10/26/16 4034 .  ceFAZolin (ANCEF) IVPB 1 g/50 mL premix, 1 g, Intravenous, Q8H, Campbell Riches, MD, 1 g at 10/26/16 1311 .  docusate sodium (COLACE) capsule 100 mg, 100 mg, Oral, BID, Dron Tanna Furry, MD, 100 mg at 10/26/16 0828 .  fluconazole (DIFLUCAN) tablet 400 mg, 400 mg, Oral, Q24H, Otilio Miu, RPH, 400 mg at 10/26/16 1553 .  insulin aspart (novoLOG) injection 0-15 Units, 0-15 Units, Subcutaneous, TID WC, Dron Tanna Furry, MD, 3 Units at 10/26/16 1311 .  insulin aspart (novoLOG) injection 0-5 Units, 0-5 Units, Subcutaneous, QHS, Dron Tanna Furry, MD, 2 Units at 10/25/16 2145 .  insulin glargine (LANTUS) injection 15 Units, 15 Units, Subcutaneous, QHS, Dron Tanna Furry, MD, 15 Units at 10/25/16 2145 .  magic mouthwash w/lidocaine, 5 mL, Oral, TID PRN,  Reyne Dumas, MD, 5 mL at 10/26/16 1107 .  ondansetron (ZOFRAN) tablet 4 mg, 4 mg, Oral, Q6H PRN **OR** ondansetron (ZOFRAN) injection 4 mg, 4 mg, Intravenous, Q6H PRN, Dron Tanna Furry, MD .  oxyCODONE (Oxy IR/ROXICODONE) immediate release tablet 5 mg, 5 mg, Oral, Q4H PRN, Dron Tanna Furry, MD, 5 mg at 10/26/16 1553 .  predniSONE (DELTASONE) tablet 5 mg, 5 mg, Oral, Q breakfast, Dron Tanna Furry, MD, 5 mg at 10/26/16 0827:  . sodium chloride   Intravenous Once  . amLODipine  10 mg Oral Daily  .  ceFAZolin (ANCEF) IV  1 g Intravenous  Q8H  . docusate sodium  100 mg Oral BID  . fluconazole  400 mg Oral Q24H  . insulin aspart  0-15 Units Subcutaneous TID WC  . insulin aspart  0-5 Units Subcutaneous QHS  . insulin glargine  15 Units Subcutaneous QHS  . predniSONE  5 mg Oral Q breakfast  :  Allergies  Allergen Reactions  . Simvastatin Other (See Comments)    REACTION: ankle swelling/fluid retention, myalgias  . Celecoxib Hives, Itching, Rash and Other (See Comments)  . Sulfonamide Derivatives Hives, Itching and Rash  . Penicillins Itching  :  History reviewed. No pertinent family history.:  Social History   Social History  . Marital status: Married    Spouse name: N/A  . Number of children: N/A  . Years of education: N/A   Occupational History  . Not on file.   Social History Main Topics  . Smoking status: Current Every Day Smoker  . Smokeless tobacco: Never Used  . Alcohol use Yes  . Drug use: No  . Sexual activity: Not on file   Other Topics Concern  . Not on file   Social History Narrative  . No narrative on file  :  Pertinent items are noted in HPI.  Exam: Patient Vitals for the past 24 hrs:  BP Temp Temp src Pulse Resp SpO2  10/26/16 1519 122/69 98 F (36.7 C) Oral 85 18 100 %  10/26/16 0503 131/66 98.5 F (36.9 C) Oral 77 18 100 %  10/25/16 2204 130/73 98 F (36.7 C) Oral 72 18 100 %   As above    Recent Labs  10/25/16 0443 10/26/16 0553  WBC 4.1 3.5*  HGB 9.9* 10.4*  HCT 27.8* 29.3*  PLT 34* 21*    Recent Labs  10/25/16 0443 10/26/16 0553  NA 136 136  K 3.2* 3.6  CL 99* 102  CO2 26 24  GLUCOSE 68 106*  BUN 15 9  CREATININE 0.85 0.71  CALCIUM 8.8* 8.9    Blood smear review:  Normochromic and normocytic population of red blood cells. I see no nucleated red blood cells. He has no teardrop cells. There is no rouleau formation. I see no schistocytes. There are no hypersegmented polys. He has no immature myeloid or lymphoid forms. Platelets are markedly decreased in  number. He has numerous large platelets that are well granulated.  Pathology: None     Assessment and Plan:  Casey Gibson is a 71 year old African-American male. He has psoriasis. He has been on methotrexate. He was admitted with abdominal wounds. Cultures so far are negative.  It is interesting to note that when he came in, his platelet count was low. Unfortunately, the only platelet count that we have to compare was 6 years ago.  I have to believe that he has underlying cirrhosis, either from alcohol or NASH. He may also have some  splenomegaly. I cannot palpate anything because of his size. He needs an ultrasound to see if there is cirrhosis.  I definitely would check his HIV and hepatitis. Of note, he does have an oral ulcer. This is under his tongue. I am not sure as to why he would have this.  As far as medication-induced problems, Ancef can cause this. Vancomycin clearly has a higher incidence of thrombocytopenia. The vancomycin has been stopped. I think he also was on Zosyn which I'm sure can also lead to some thrombocytopenia.  He has a normal PT and PTT. I just don't think he has any type of microangiopathic cause for thrombocytopenia (i.e. TTP/HUS).  I don't see any issue with giving him platelets. It will be interesting to see how well he holds onto the platelets. I would check a platelet count 1 hour after his transfusion.  He does not need a bone marrow biopsy.  I have to believe that this is iatrogenic to some degree. The fact that he already had mild thrombocytopenia when he came and could be from methotrexate. I would probably try to hold the methotrexate for a while.  He is very nice. He was fun to talk to.  We will follow along.  We might consider Nplate for him. This might be a reasonable option to consider. 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 It is apparent that his blood  sugars are not that well controlled given his blood sugar of 500 when he came in.  I spent about an hour with him. Again, he was fun to talk to him.  Casey Haw, MD  John 14:14

## 2016-10-26 NOTE — Progress Notes (Signed)
Pharmacy Antibiotic Note  Casey Gibson is a 71 y.o. diabetic  male admitted on 10/23/2016 with cellulitis of his pannus and oral thrush. Pharmacy has been consulted for oral fluconazole dosing. Renal function stable. ID following. Patient denied his listed PCN allergy.   Plan: Continue Fluconazole  PO daily Continue Cefazolin 1 gram q 8 hours per MD Monitor clinical progress, c/s, renal function, abx plan/LOT   Height:  (172.7 cm) Weight: 275 lb (124.7 kg) IBW/kg (Calculated) : 68.4  Temp (24hrs), Avg:98.2 F (36.8 C), Min:98 F (36.7 C), Max:98.5 F (36.9 C)   Recent Labs Lab 10/23/16 1501 10/23/16 1513 10/24/16 0439 10/25/16 0443 10/26/16 0553  WBC 2.4*  --  3.3* 4.1 3.5*  CREATININE 1.35*  --  1.02 0.85 0.71  LATICACIDVEN  --  3.56*  --   --   --     Estimated Creatinine Clearance: 113.6 mL/min (by C-G formula based on SCr of 0.71 mg/dL).    Allergies  Allergen Reactions  . Simvastatin Other (See Comments)    REACTION: ankle swelling/fluid retention, myalgias  . Celecoxib Hives, Itching, Rash and Other (See Comments)  . Sulfonamide Derivatives Hives, Itching and Rash  . Penicillins Itching    Antimicrobials this admission: 4/6 Vancomycin >>4/7 4/6 Zosyn >>4/7 4/6 PO fluconazole >> 4/7 cefazolin >>  Dose adjustments this admission: n/a  Microbiology results: 4/6 BC: ngtd  Thank you for allowing [place] to participate in this patients care.  Signe Colt, PharmD Clinical phone for 10/26/2016 from 7a-3:30p: [phone] If after 3:30p, please call main pharmacy at: [phone] 10/26/2016 12:23 PM

## 2016-10-26 NOTE — Progress Notes (Signed)
Inpatient Diabetes Program Recommendations  AACE/ADA: New Consensus Statement on Inpatient Glycemic Control (2015)  Target Ranges:  Prepandial:   less than 140 mg/dL      Peak postprandial:   less than 180 mg/dL (1-2 hours)      Critically ill patients:  140 - 180 mg/dL   Spoke with patient and followed up with his A1c levels being 6.5% with low Hgb levels and his admitting glucose at 532 mg/dl.  Patient reports forgetting that he already had taken prednisone and accidentally took a second dose. Patient reports occasionally drinking juice. Spoke with patient about glucose levels at home. He reports checking his glucose 1-2 times a month. Spoke to patient about checking his glucose at least twice a day and rechecking in with his PCP for medication adjustments and A1c level verification and Hgb levels. Spoke to patient about the importance of glucose control and glucose monitoring.  Thanks,  Christena Deem RN, MSN, [facility] Inpatient Diabetes Coordinator Team Pager [phone] (8a-5p)

## 2016-10-26 NOTE — Progress Notes (Signed)
PROGRESS NOTE    Casey Gibson  RUE:[id number] DOB: [birth year] DOA: 10/23/2016 PCP: [name], MD   Brief Narrative: 71 y.o. male with medical history significant of hypertension, obesity, psoriasis on prednisone and weekly methotrexate, diabetes mellitus presented to the hospital for the evaluation of worsening abdominal and sacral rash with drainage, oozing and pain. Now with pancytopenia especially thrombocytopenia  Assessment & Plan:   Active Problems:   Cellulitis   Pressure injury of skin   Pancytopenia (HCC)   Thrush   Psoriasis   Metabolic acidosis, increased anion gap (IAG)   # Abdomen wall and peri-anal cellulitis, possibly superimposed fungal infection: Patient is immunocompromised because of methotrexate, prednisone and history of diabetes. The blisters are open and has drainage Broad-spectrum antibiotics discontinued, patient switched to Ancef, continue fluconazole by infectious disease -follow up culture results.  -Appreciate ID input, Dr. Ninetta Lights.  -wound care consult -recommendations made.  Follow blood cultures from 4/6   #Type 2 DM with hyperglycemia and possible mild diabetic ketoacidosis without coma. -Patient takes oral medication at home. Started Lantus and sliding scale in the hospital. Blood sugar is still elevated. Continue Lantus to 15 units.  . Monitor blood sugar level. a1c 6.5.   #Acute kidney injury in the setting of hyperglycemia, dehydration and NSAIDs use: UA unremarkable. Creatinine had increased to 1.35, now 0.71. Serum creatinine level already improved. Off IV fluids since patient has good oral intake. Monitor BMP. Avoid nephrotoxins.   #Hyponatremia likely in the setting of hyperglycemia and hydrochlorothiazide: Serum sodium level improved to 133 >136. Hyperglycemia improving. Monitor serum potassium level. Continue to hold hydrochlorothiazide.  #History of psoriasis follows up with rheumatologist outpatient:Dr Casimer Lanius.  Continue home dose of prednisone. Patient is not in  distress or septic therefore will not order  stress dose of steroid. Patient takes weekly methotrexate.  #Leukopenia and thrombocytopenia likely contributed by methotrexate. No sign of bleeding. Monitor CBC. Platelet count dropped to 96>21 today. Check DIC panel, PT/INR, LDH, peripheral smear. Consulted hematology oncology Dr.Ennever  #Hypokalemia-replete  # Anemia of chronic disease /ABLA  Baseline 12.2, slow oozing , hg now 9.9, will recheck in am    DVT prophylaxis: SCD. Dc lovenox because of thrombocytopenia Code Status: Full code Family Communication: No family present at bedside Disposition Plan: Hematology oncology consultation    Consultants:   Infectious disease  Hematology consultation  Procedures: None Antimicrobials: IV vancomycin, Zosyn and fluconazole since 4/6 Cefazolin  Subjective: Complaining of difficulty swallowing  Oozing blood from perineal area  Objective: Vitals:   10/25/16 0522 10/25/16 1518 10/25/16 2204 10/26/16 0503  BP: 132/71 130/68 130/73 131/66  Pulse: 79 75 72 77  Resp: Temp: 98.3 F (36.8 C) 98.1 F (36.7 C) 98 F (36.7 C) 98.5 F (36.9 C)  TempSrc: Oral Oral Oral Oral  SpO2: 99% 100% 100% 100%  Weight:      Height:        Intake/Output Summary (Last 24 hours) at 10/26/16 0814 Last data filed at 10/26/16 0523  Gross per 24 hour  Intake              820 ml  Output             1150 ml  Net             -330 ml   Filed Weights   10/23/16 1500 10/23/16 1829  Weight: 126.1 kg (278 lb) 124.7 kg (275 lb)    Examination:  General  exam: Appears calm and comfortable  Respiratory system: Clear to auscultation. Respiratory effort normal. No wheezing or crackle Cardiovascular system: S1 & S2 heard, RRR.  No pedal edema. Gastrointestinal system: Abdomen pannus has open wound with no drainage today., Open wound around the perianal area. Central nervous system: Alert  and oriented. No focal neurological deficits. Extremities: Symmetric 5 x 5 power. Skin:  dermatitis with partial and full thickness skin loss in the subpannicular region, the bilateral inguinal regions, the right scrotal area and the intergluteal cleft Psychiatry: Judgement and insight appear normal. Mood & affect appropriate.     Data Reviewed: I have personally reviewed following labs and imaging studies  CBC:  Recent Labs Lab 10/23/16 1501 10/24/16 0439 10/25/16 0443 10/26/16 0553  WBC 2.4* 3.3* 4.1 3.5*  NEUTROABS 1.8  --   --   --   HGB 12.2* 10.4* 9.9* 10.4*  HCT 34.3* 29.4* 27.8* 29.3*  MCV 95.5 96.1 95.2 95.4  PLT 96* 59* 34* 21*   Basic Metabolic Panel:  Recent Labs Lab 10/23/16 1501 10/24/16 0439 10/25/16 0443 10/26/16 0553  NA 128* 133* 136 136  K 5.1 3.4* 3.2* 3.6  CL 90* 98* 99* 102  CO2 GLUCOSE 505* 181* 68 106*  BUN 34* 24* 15 9  CREATININE 1.35* 1.02 0.85 0.71  CALCIUM 9.1 8.4* 8.8* 8.9   GFR: Estimated Creatinine Clearance: 113.6 mL/min (by C-G formula based on SCr of 0.71 mg/dL). Liver Function Tests:  Recent Labs Lab 10/23/16 1501 10/26/16 0553  AST 38 61*  ALT 45 62  ALKPHOS 70 62  BILITOT 1.4* 0.6  PROT 7.4 6.4*  ALBUMIN 3.1* 2.7*   No results for input(s): LIPASE, AMYLASE in the last 168 hours. No results for input(s): AMMONIA in the last 168 hours. Coagulation Profile: No results for input(s): INR, PROTIME in the last 168 hours. Cardiac Enzymes: No results for input(s): CKTOTAL, CKMB, CKMBINDEX, TROPONINI in the last 168 hours. BNP (last 3 results) No results for input(s): PROBNP in the last 8760 hours. HbA1C:  Recent Labs  10/23/16 1945  HGBA1C 6.5*   CBG:  Recent Labs Lab 10/25/16 0759 10/25/16 1211 10/25/16 1712 10/25/16 2136 10/26/16 0801  GLUCAP 75 128* 182* 202* 99   Lipid Profile: No results for input(s): CHOL, HDL, LDLCALC, TRIG, CHOLHDL, LDLDIRECT in the last 72 hours. Thyroid Function  Tests: No results for input(s): TSH, T4TOTAL, FREET4, T3FREE, THYROIDAB in the last 72 hours. Anemia Panel: No results for input(s): VITAMINB12, FOLATE, FERRITIN, TIBC, IRON, RETICCTPCT in the last 72 hours. Sepsis Labs:  Recent Labs Lab 10/23/16 1513  LATICACIDVEN 3.56*    Recent Results (from the past 240 hour(s))  Blood culture (routine x 2)     Status: None (Preliminary result)   Collection Time: 10/23/16  5:08 PM  Result Value Ref Range Status   Specimen Description BLOOD LEFT HAND  Final   Special Requests   Final    BOTTLES DRAWN AEROBIC ONLY Blood Culture adequate volume   Culture NO GROWTH 2 DAYS  Final   Report Status PENDING  Incomplete  Blood culture (routine x 2)     Status: None (Preliminary result)   Collection Time: 10/23/16  6:24 PM  Result Value Ref Range Status   Specimen Description BLOOD LEFT HAND  Final   Special Requests   Final    BOTTLES DRAWN AEROBIC ONLY Blood Culture results may not be optimal due to an inadequate volume of blood received in culture  bottles   Culture NO GROWTH 2 DAYS  Final   Report Status PENDING  Incomplete         Radiology Studies: No results found.      Scheduled Meds: . sodium chloride   Intravenous Once  . amLODipine  10 mg Oral Daily  .  ceFAZolin (ANCEF) IV  1 g Intravenous Q8H  . docusate sodium  100 mg Oral BID  . fluconazole  400 mg Oral Q24H  . insulin aspart  0-15 Units Subcutaneous TID WC  . insulin aspart  0-5 Units Subcutaneous QHS  . insulin glargine  15 Units Subcutaneous QHS  . predniSONE  5 mg Oral Q breakfast   Continuous Infusions:    LOS: 3 days    Richarda Overlie, MD [facility] Pager [phone]  If 7PM-7AM, please contact night-coverage [email or web address] Password [email or web address] 10/26/2016, 8:14 AM

## 2016-10-26 NOTE — Progress Notes (Signed)
Pt"s platelets 21 MD notified.

## 2016-10-27 ENCOUNTER — Inpatient Hospital Stay (HOSPITAL_COMMUNITY): Payer: Medicare Other

## 2016-10-27 DIAGNOSIS — Z789 Other specified health status: Secondary | ICD-10-CM

## 2016-10-27 DIAGNOSIS — E872 Acidosis, unspecified: Secondary | ICD-10-CM

## 2016-10-27 DIAGNOSIS — K121 Other forms of stomatitis: Secondary | ICD-10-CM

## 2016-10-27 DIAGNOSIS — Z7289 Other problems related to lifestyle: Secondary | ICD-10-CM

## 2016-10-27 LAB — PREPARE PLATELET PHERESIS: UNIT DIVISION: 0

## 2016-10-27 LAB — COMPREHENSIVE METABOLIC PANEL
ALK PHOS: 71 U/L (ref 38–126)
ALT: 54 U/L (ref 17–63)
AST: 49 U/L — AB (ref 15–41)
Albumin: 2.9 g/dL — ABNORMAL LOW (ref 3.5–5.0)
Anion gap: 12 (ref 5–15)
BUN: 5 mg/dL — AB (ref 6–20)
CALCIUM: 9.3 mg/dL (ref 8.9–10.3)
CHLORIDE: 100 mmol/L — AB (ref 101–111)
CO2: 27 mmol/L (ref 22–32)
CREATININE: 0.76 mg/dL (ref 0.61–1.24)
Glucose, Bld: 95 mg/dL (ref 65–99)
Potassium: 3.6 mmol/L (ref 3.5–5.1)
Sodium: 139 mmol/L (ref 135–145)
Total Bilirubin: 0.8 mg/dL (ref 0.3–1.2)
Total Protein: 6.6 g/dL (ref 6.5–8.1)

## 2016-10-27 LAB — CBC
HEMATOCRIT: 29.1 % — AB (ref 39.0–52.0)
Hemoglobin: 10.3 g/dL — ABNORMAL LOW (ref 13.0–17.0)
MCH: 33.9 pg (ref 26.0–34.0)
MCHC: 35.4 g/dL (ref 30.0–36.0)
MCV: 95.7 fL (ref 78.0–100.0)
PLATELETS: 50 10*3/uL — AB (ref 150–400)
RBC: 3.04 MIL/uL — AB (ref 4.22–5.81)
RDW: 13.7 % (ref 11.5–15.5)
WBC: 3.1 10*3/uL — AB (ref 4.0–10.5)

## 2016-10-27 LAB — CBC WITH DIFFERENTIAL/PLATELET
BASOS ABS: 0 10*3/uL (ref 0.0–0.1)
BASOS PCT: 0 %
EOS PCT: 2 %
Eosinophils Absolute: 0.1 10*3/uL (ref 0.0–0.7)
HEMATOCRIT: 28.3 % — AB (ref 39.0–52.0)
Hemoglobin: 10 g/dL — ABNORMAL LOW (ref 13.0–17.0)
Lymphocytes Relative: 57 %
Lymphs Abs: 2.2 10*3/uL (ref 0.7–4.0)
MCH: 33.6 pg (ref 26.0–34.0)
MCHC: 35.3 g/dL (ref 30.0–36.0)
MCV: 95 fL (ref 78.0–100.0)
MONO ABS: 0.1 10*3/uL (ref 0.1–1.0)
MONOS PCT: 3 %
NEUTROS ABS: 1.5 10*3/uL — AB (ref 1.7–7.7)
Neutrophils Relative %: 39 %
PLATELETS: 55 10*3/uL — AB (ref 150–400)
RBC: 2.98 MIL/uL — ABNORMAL LOW (ref 4.22–5.81)
RDW: 13.7 % (ref 11.5–15.5)
WBC: 3.8 10*3/uL — ABNORMAL LOW (ref 4.0–10.5)

## 2016-10-27 LAB — BPAM PLATELET PHERESIS
BLOOD PRODUCT EXPIRATION DATE: 201804100714
ISSUE DATE / TIME: 201804092002
Unit Type and Rh: 6200

## 2016-10-27 LAB — C-REACTIVE PROTEIN: CRP: 3.6 mg/dL — ABNORMAL HIGH (ref <1.0)

## 2016-10-27 LAB — DIC (DISSEMINATED INTRAVASCULAR COAGULATION) PANEL
D DIMER QUANT: 0.66 ug{FEU}/mL — AB (ref 0.00–0.50)
INR: 1.12
PROTHROMBIN TIME: 14.4 s (ref 11.4–15.2)

## 2016-10-27 LAB — HIV ANTIBODY (ROUTINE TESTING W REFLEX): HIV SCREEN 4TH GENERATION: NONREACTIVE

## 2016-10-27 LAB — DIC (DISSEMINATED INTRAVASCULAR COAGULATION)PANEL
Fibrinogen: 582 mg/dL — ABNORMAL HIGH (ref 210–475)
Platelets: 53 10*3/uL — ABNORMAL LOW (ref 150–400)
Smear Review: NONE SEEN
aPTT: 29 seconds (ref 24–36)

## 2016-10-27 LAB — GLUCOSE, CAPILLARY
GLUCOSE-CAPILLARY: 168 mg/dL — AB (ref 65–99)
GLUCOSE-CAPILLARY: 186 mg/dL — AB (ref 65–99)
Glucose-Capillary: 92 mg/dL (ref 65–99)
Glucose-Capillary: 167 mg/dL — ABNORMAL HIGH (ref 65–99)

## 2016-10-27 LAB — SEDIMENTATION RATE: Sed Rate: 109 mm/hr — ABNORMAL HIGH (ref 0–16)

## 2016-10-27 MED ORDER — CEPHALEXIN 500 MG PO CAPS
500.0000 mg | ORAL_CAPSULE | Freq: Four times a day (QID) | ORAL | Status: DC
  Administered 2016-10-27 – 2016-10-28 (×5): 500 mg via ORAL
  Filled 2016-10-27 (×5): qty 1

## 2016-10-27 NOTE — Care Management Important Message (Signed)
Important Message  Patient Details  Name: Casey Gibson MRN: [id number] Date of Birth: [birth year]   Medicare Important Message Given:  Yes    Karim Aiello Abena 10/27/2016, 11:54 AM

## 2016-10-27 NOTE — Progress Notes (Signed)
Subjective: Feels better   Antibiotics:  Anti-infectives    Start     Dose/Rate Route Frequency Ordered Stop   10/27/16 1200  cephALEXin (KEFLEX) capsule 500 mg     500 mg Oral Every 6 hours 10/27/16 0850     10/24/16 2200  ceFAZolin (ANCEF) IVPB 1 g/50 mL premix  Status:  Discontinued     1 g 100 mL/hr over 30 Minutes Intravenous Every 8 hours 10/24/16 1751 10/27/16 0850   10/24/16 1600  fluconazole (DIFLUCAN) tablet 400 mg     400 mg Oral Every 24 hours 10/23/16 1602     10/24/16 0400  vancomycin (VANCOCIN) IVPB 1000 mg/200 mL premix  Status:  Discontinued     1,000 mg 200 mL/hr over 60 Minutes Intravenous Every 12 hours 10/23/16 1602 10/24/16 1751   10/24/16 0000  piperacillin-tazobactam (ZOSYN) IVPB 3.375 g  Status:  Discontinued     3.375 g 12.5 mL/hr over 240 Minutes Intravenous Every 8 hours 10/23/16 1602 10/24/16 1751   10/23/16 1515  vancomycin (VANCOCIN) 2,500 mg in sodium chloride 0.9 % 500 mL IVPB     2,500 mg 250 mL/hr over 120 Minutes Intravenous  Once 10/23/16 1513 10/23/16 1804   10/23/16 1515  piperacillin-tazobactam (ZOSYN) IVPB 3.375 g     3.375 g 100 mL/hr over 30 Minutes Intravenous  Once 10/23/16 1513 10/23/16 1546   10/23/16 1515  fluconazole (DIFLUCAN) tablet 400 mg     400 mg Oral  Once 10/23/16 1513 10/23/16 1516      Medications: Scheduled Meds: . sodium chloride   Intravenous Once  . amLODipine  10 mg Oral Daily  . cephALEXin  500 mg Oral Q6H  . docusate sodium  100 mg Oral BID  . fluconazole  400 mg Oral Q24H  . insulin aspart  0-15 Units Subcutaneous TID WC  . insulin aspart  0-5 Units Subcutaneous QHS  . insulin glargine  15 Units Subcutaneous QHS  . predniSONE  5 mg Oral Q breakfast   Continuous Infusions: PRN Meds:.acetaminophen **OR** acetaminophen, magic mouthwash w/lidocaine, ondansetron **OR** ondansetron (ZOFRAN) IV, oxyCODONE    Objective: Weight change:   Intake/Output Summary (Last 24 hours) at 10/27/16  1739 Last data filed at 10/27/16 0737  Gross per 24 hour  Intake             1294 ml  Output             2340 ml  Net            -1046 ml   Blood pressure (!) 127/50, pulse 78, temperature 98.3 F (36.8 C), temperature source Oral, resp. rate 16, height  (1.727 m), weight 275 lb (124.7 kg), SpO2 100 %. Temp:  [98.2 F (36.8 C)-98.3 F (36.8 C)] 98.3 F (36.8 C) (04/10 1543) Pulse Rate:  [65-82] 78 (04/10 1543) Resp:  [16-19] 16 (04/10 1543) BP: (127-146)/(50-68) 127/50 (04/10 1543) SpO2:  [99 %-100 %] 100 % (04/10 1543)  Physical Exam: General: Alert and awake, oriented x3, not in any acute distress. HEENT: anicteric sclera, EOMI ulcer on tongue       CVS regular rate, normal r,  no murmur rubs or gallops Chest: clear to auscultation bilaterally, no wheezing, rales or rhonchi Abdomen: soft nontender, nondistended, normal bowel sounds, Extremities: skin  10/27/16:        Neuro: nonfocal  CBC: CBC Latest Ref Rng & Units 10/27/2016 10/27/2016 10/27/2016  WBC 4.0 - 10.5 K/uL  3.1(L) - 3.8(L)  Hemoglobin 13.0 - 17.0 g/dL 10.3(L) - 10.0(L)  Hematocrit 39.0 - 52.0 % 29.1(L) - 28.3(L)  Platelets 150 - 400 K/uL 50(L) 53(L) 55(L)     BMET  Recent Labs  10/26/16 0553 10/27/16 0852  NA 136 139  K 3.6 3.6  CL 102 100*  CO2 24 27  GLUCOSE 106* 95  BUN 9 5*  CREATININE 0.71 0.76  CALCIUM 8.9 9.3     Liver Panel   Recent Labs  10/26/16 0553 10/27/16 0852  PROT 6.4* 6.6  ALBUMIN 2.7* 2.9*  AST 61* 49*  ALT 62 54  ALKPHOS 62 71  BILITOT 0.6 0.8       Sedimentation Rate  Recent Labs  10/27/16 0852  ESRSEDRATE 109*   C-Reactive Protein  Recent Labs  10/27/16 0852  CRP 3.6*    Micro Results: Recent Results (from the past 720 hour(s))  Blood culture (routine x 2)     Status: None (Preliminary result)   Collection Time: 10/23/16  5:08 PM  Result Value Ref Range Status   Specimen Description BLOOD LEFT HAND  Final   Special Requests    Final    BOTTLES DRAWN AEROBIC ONLY Blood Culture adequate volume   Culture NO GROWTH 4 DAYS  Final   Report Status PENDING  Incomplete  Blood culture (routine x 2)     Status: None (Preliminary result)   Collection Time: 10/23/16  6:24 PM  Result Value Ref Range Status   Specimen Description BLOOD LEFT HAND  Final   Special Requests   Final    BOTTLES DRAWN AEROBIC ONLY Blood Culture results may not be optimal due to an inadequate volume of blood received in culture bottles   Culture NO GROWTH 4 DAYS  Final   Report Status PENDING  Incomplete    Studies/Results: US Abdomen Complete  Result Date: 10/27/2016 CLINICAL DATA:  Heavy alcohol use for about 2 years, thrombocytopenia EXAM: ABDOMEN ULTRASOUND COMPLETE COMPARISON:  None. FINDINGS: Gallbladder: No gallstones or wall thickening visualized. No sonographic Murphy sign noted by sonographer. Common bile duct: Diameter: 5.6 mm in diameter within normal limits Liver: No focal lesion identified. Diffuse increased echogenicity of the liver suspicious for fatty infiltration. IVC: No abnormality visualized. Pancreas: Visualized portion unremarkable. Spleen: Size and appearance within normal limits. Measures 9.4 cm in length Right Kidney: Length: 11.9 cm. Echogenicity within normal limits. No mass or hydronephrosis visualized. Left Kidney: Length: 12.3 cm. Echogenicity within normal limits. No mass or hydronephrosis visualized. Abdominal aorta: No aneurysm visualized. Measures up to 2.7 cm in diameter. Other findings: None. IMPRESSION: 1. No gallstones are noted within gallbladder.  Normal CBD 2. Diffuse increased echogenicity of the liver suspicious for fatty infiltration. No focal hepatic mass. 3. No hydronephrosis. 4. No aortic aneurysm. Electronically Signed   By: Natasha Mead M.D.   On: 10/27/2016 07:59      Assessment/Plan:  INTERVAL HISTORY: pt feels better  Seen by Dr Myna Hidalgo re his TTPenia   Active Problems:   Cellulitis   Pressure  injury of skin   Pancytopenia (HCC)   Thrush   Psoriasis   Metabolic acidosis, increased anion gap (IAG)   Lactic acidosis    Casey Gibson is a 71 y.o. male with  male with psoriasis on MTX and prednisone with multiple ulcers and skin lesions on fluconazole and cefazolin. He had pancytopenia on admission with worsening of his thrombocytopenia in the interim.  #1 Ulcers with possible superinfection;  I  have changed to oral Keflex and oral fluconazole and he can continue both for a total of 2 weeks of therapy.  Continue wound care but would consider transfer to [facility] with Dermatology, Rheumatology and WOC  if he worsens.  Agree could consider biopsy his rash  #2 Pancytopenia: Dr. Myna Hidalgo is working up the patient. Wonder if related to his MTX,   #3 Oral ulcers: also ? Secondary to MTX  I will sign off for now please call back with further questions.    LOS: 4 days   [name] 10/27/2016, 5:39 PM

## 2016-10-27 NOTE — Progress Notes (Signed)
Mr. Wandrey is about the same. His platelet count is 55,000 today. He got a unit of platelets yesterday (I think??). There is no bleeding.  He'll have his ultrasound today. Again I have to see if he has any underlying cirrhosis.  The white cell count is 3.8. His hemoglobin is 10.  It is possible that the thrombocytopenia could be from his methotrexate.  I have to believe that the thrombocytopenia is medication induced. I don't see that there is any indication for a bone marrow test.  On his physical exam, there is no overall change. His blood pressure 133/59. Temperature 98.2. Pulse is 69. Skin exam does show the dressings on his open wounds. He is morbidly obese. He does have chronic edema in his legs.  For now, I will just follow his platelet count. Again, I just find it interesting that when he came in his platelet count was a bit and the low side. This could've been from methotrexate. It could be from infection. It could be from the possibility of cirrhosis/splenomegaly.  We will continue to see how his test results come out.  As always, I am pretty hard for him. He is very nice.  Christin Bach, MD  Hebrews 13:16

## 2016-10-27 NOTE — Progress Notes (Signed)
PROGRESS NOTE    Casey Gibson  UJW:[id number] DOB: 1946/02/21 DOA: 10/23/2016 PCP: London Pepper, MD   Brief Narrative: 71 y.o. male with medical history significant of hypertension, obesity, psoriasis on prednisone and weekly methotrexate, diabetes mellitus presented to the hospital for the evaluation of worsening abdominal and sacral rash with drainage, oozing and pain. Now with pancytopenia especially thrombocytopenia  Assessment & Plan:   Active Problems:   Cellulitis   Pressure injury of skin   Pancytopenia (HCC)   Thrush   Psoriasis   Metabolic acidosis, increased anion gap (IAG)   Lactic acidosis   # Abdomen wall and peri-anal cellulitis, possibly superimposed fungal infection: Patient is immunocompromised because of methotrexate, prednisone and history of diabetes. The blisters are open and has drainage Broad-spectrum antibiotics discontinued, patient switched to Ancef, continue fluconazole by infectious disease -follow up culture results.  Appreciate infectious disease input- Follow wound care recommendations Follow blood cultures from 4/6-no growth so far   #Type 2 DM with hyperglycemia and possible mild diabetic ketoacidosis without coma. CBG 532  on admission. Anion gap 16 -Patient takes oral medication at home. Continue Lantus and sliding scale in the hospital. Hemoglobin A1c 6.5   #Acute kidney injury in the setting of hyperglycemia, dehydration and NSAIDs use: UA unremarkable. Creatinine had increased to 1.35, now 0.71. Serum creatinine level already improved. Off IV fluids since patient has good oral intake. Monitor BMP. Avoid nephrotoxins.   #Hyponatremia likely in the setting of hyperglycemia and hydrochlorothiazide: Serum sodium level improved to 133 >136. Hyperglycemia improving. Monitor serum potassium level. Continue to hold hydrochlorothiazide.  #History of psoriasis follows up with rheumatologist outpatient:Dr Lahoma Rocker. Continue home dose of  prednisone. Patient is not in  distress or septic therefore will not order  stress dose of steroid. Patient takes weekly methotrexate which has been held due to pancytopenia.  #Leukopenia and thrombocytopenia likely contributed by methotrexate. No sign of bleeding. Monitor CBC. Platelet count dropped to 96>21 hematology consulted. Status post 1 unit of platelets. Platelet count has improved to 50. Thrombocytopenia likely medication induced. No indication for bone marrow biopsy. Appreciate hematology oncology Dr.Ennever  #Hypokalemia-replete  # Anemia of chronic disease /ABLA  Baseline 12.2, slow oozing , hemoglobin remaining stable. No indication for PRBC transfusion at this time    DVT prophylaxis: SCD. Dc lovenox because of thrombocytopenia Code Status: Full code Family Communication: No family present at bedside Disposition Plan: Anticipate discharge tomorrow if platelet count stable     Consultants:   Infectious disease  Hematology consultation  Procedures: None Antimicrobials: IV vancomycin, Zosyn and fluconazole since 4/6 Cefazolin  Subjective: Complaining of difficulty swallowing  Oozing blood from perineal area  Objective: Vitals:   10/26/16 2015 10/26/16 2022 10/26/16 2337 10/27/16 0454  BP: (!) 146/68 (!) 146/68 (!) 129/54 (!) 133/59  Pulse: 82 82 65 69  Resp: 19 19 16 18   Temp: 98.3 F (36.8 C) 98.3 F (36.8 C)  98.2 F (36.8 C)  TempSrc: Oral Oral  Oral  SpO2: 100%  99% 100%  Weight:      Height:        Intake/Output Summary (Last 24 hours) at 10/27/16 0949 Last data filed at 10/27/16 0737  Gross per 24 hour  Intake             1534 ml  Output             2340 ml  Net             -  806 ml   Filed Weights   10/23/16 1500 10/23/16 1829  Weight: 126.1 kg (278 lb) 124.7 kg (275 lb)    Examination:  General exam: Appears calm and comfortable  Respiratory system: Clear to auscultation. Respiratory effort normal. No wheezing or  crackle Cardiovascular system: S1 & S2 heard, RRR.  No pedal edema. Gastrointestinal system: Abdomen pannus has open wound with no drainage today., Open wound around the perianal area. Central nervous system: Alert and oriented. No focal neurological deficits. Extremities: Symmetric 5 x 5 power. Skin:  dermatitis with partial and full thickness skin loss in the subpannicular region, the bilateral inguinal regions, the right scrotal area and the intergluteal cleft Psychiatry: Judgement and insight appear normal. Mood & affect appropriate.     Data Reviewed: I have personally reviewed following labs and imaging studies  CBC:  Recent Labs Lab 10/23/16 1501 10/24/16 0439 10/25/16 0443 10/26/16 0553 10/27/16 0025 10/27/16 0852  WBC 2.4* 3.3* 4.1 3.5* 3.8* 3.1*  NEUTROABS 1.8  --   --   --  1.5*  --   HGB 12.2* 10.4* 9.9* 10.4* 10.0* 10.3*  HCT 34.3* 29.4* 27.8* 29.3* 28.3* 29.1*  MCV 95.5 96.1 95.2 95.4 95.0 95.7  PLT 96* 59* 34* 21* 53*  55* 50*   Basic Metabolic Panel:  Recent Labs Lab 10/23/16 1501 10/24/16 0439 10/25/16 0443 10/26/16 0553  NA 128* 133* 136 136  K 5.1 3.4* 3.2* 3.6  CL 90* 98* 99* 102  CO2 22 24 26 24   GLUCOSE 505* 181* 68 106*  BUN 34* 24* 15 9  CREATININE 1.35* 1.02 0.85 0.71  CALCIUM 9.1 8.4* 8.8* 8.9   GFR: Estimated Creatinine Clearance: 113.6 mL/min (by C-G formula based on SCr of 0.71 mg/dL). Liver Function Tests:  Recent Labs Lab 10/23/16 1501 10/26/16 0553  AST 38 61*  ALT 45 62  ALKPHOS 70 62  BILITOT 1.4* 0.6  PROT 7.4 6.4*  ALBUMIN 3.1* 2.7*   No results for input(s): LIPASE, AMYLASE in the last 168 hours. No results for input(s): AMMONIA in the last 168 hours. Coagulation Profile:  Recent Labs Lab 10/26/16 0818 10/27/16 0025  INR 1.12 1.12   Cardiac Enzymes: No results for input(s): CKTOTAL, CKMB, CKMBINDEX, TROPONINI in the last 168 hours. BNP (last 3 results) No results for input(s): PROBNP in the last 8760  hours. HbA1C: No results for input(s): HGBA1C in the last 72 hours. CBG:  Recent Labs Lab 10/26/16 0801 10/26/16 1220 10/26/16 1719 10/26/16 2205 10/27/16 0757  GLUCAP 99 163* 190* 108* 92   Lipid Profile: No results for input(s): CHOL, HDL, LDLCALC, TRIG, CHOLHDL, LDLDIRECT in the last 72 hours. Thyroid Function Tests: No results for input(s): TSH, T4TOTAL, FREET4, T3FREE, THYROIDAB in the last 72 hours. Anemia Panel: No results for input(s): VITAMINB12, FOLATE, FERRITIN, TIBC, IRON, RETICCTPCT in the last 72 hours. Sepsis Labs:  Recent Labs Lab 10/23/16 1513  LATICACIDVEN 3.56*    Recent Results (from the past 240 hour(s))  Blood culture (routine x 2)     Status: None (Preliminary result)   Collection Time: 10/23/16  5:08 PM  Result Value Ref Range Status   Specimen Description BLOOD LEFT HAND  Final   Special Requests   Final    BOTTLES DRAWN AEROBIC ONLY Blood Culture adequate volume   Culture NO GROWTH 2 DAYS  Final   Report Status PENDING  Incomplete  Blood culture (routine x 2)     Status: None (Preliminary result)   Collection Time:  10/23/16  6:24 PM  Result Value Ref Range Status   Specimen Description BLOOD LEFT HAND  Final   Special Requests   Final    BOTTLES DRAWN AEROBIC ONLY Blood Culture results may not be optimal due to an inadequate volume of blood received in culture bottles   Culture NO GROWTH 2 DAYS  Final   Report Status PENDING  Incomplete         Radiology Studies: US Abdomen Complete  Result Date: 10/27/2016 CLINICAL DATA:  Heavy alcohol use for about 2 years, thrombocytopenia EXAM: ABDOMEN ULTRASOUND COMPLETE COMPARISON:  None. FINDINGS: Gallbladder: No gallstones or wall thickening visualized. No sonographic Murphy sign noted by sonographer. Common bile duct: Diameter: 5.6 mm in diameter within normal limits Liver: No focal lesion identified. Diffuse increased echogenicity of the liver suspicious for fatty infiltration. IVC: No  abnormality visualized. Pancreas: Visualized portion unremarkable. Spleen: Size and appearance within normal limits. Measures 9.4 cm in length Right Kidney: Length: 11.9 cm. Echogenicity within normal limits. No mass or hydronephrosis visualized. Left Kidney: Length: 12.3 cm. Echogenicity within normal limits. No mass or hydronephrosis visualized. Abdominal aorta: No aneurysm visualized. Measures up to 2.7 cm in diameter. Other findings: None. IMPRESSION: 1. No gallstones are noted within gallbladder.  Normal CBD 2. Diffuse increased echogenicity of the liver suspicious for fatty infiltration. No focal hepatic mass. 3. No hydronephrosis. 4. No aortic aneurysm. Electronically Signed   By: Lahoma Crocker M.D.   On: 10/27/2016 07:59        Scheduled Meds: . sodium chloride   Intravenous Once  . amLODipine  10 mg Oral Daily  . cephALEXin  500 mg Oral Q6H  . docusate sodium  100 mg Oral BID  . fluconazole  400 mg Oral Q24H  . insulin aspart  0-15 Units Subcutaneous TID WC  . insulin aspart  0-5 Units Subcutaneous QHS  . insulin glargine  15 Units Subcutaneous QHS  . predniSONE  5 mg Oral Q breakfast   Continuous Infusions:    LOS: 4 days    Reyne Dumas, MD [facility] Pager [phone]  If 7PM-7AM, please contact night-coverage [email or web address] Password [facility] 10/27/2016, 9:49 AM

## 2016-10-28 ENCOUNTER — Other Ambulatory Visit: Payer: Self-pay | Admitting: Hematology & Oncology

## 2016-10-28 DIAGNOSIS — D696 Thrombocytopenia, unspecified: Secondary | ICD-10-CM | POA: Insufficient documentation

## 2016-10-28 LAB — DIC (DISSEMINATED INTRAVASCULAR COAGULATION)PANEL
D-Dimer, Quant: 0.87 ug/mL-FEU — ABNORMAL HIGH (ref 0.00–0.50)
Fibrinogen: 577 mg/dL — ABNORMAL HIGH (ref 210–475)
INR: 1.15
Prothrombin Time: 14.8 seconds (ref 11.4–15.2)
Smear Review: NONE SEEN

## 2016-10-28 LAB — CULTURE, BLOOD (ROUTINE X 2)
CULTURE: NO GROWTH
Culture: NO GROWTH
SPECIAL REQUESTS: ADEQUATE

## 2016-10-28 LAB — COMPREHENSIVE METABOLIC PANEL
ALBUMIN: 2.8 g/dL — AB (ref 3.5–5.0)
ALK PHOS: 73 U/L (ref 38–126)
ALT: 43 U/L (ref 17–63)
AST: 36 U/L (ref 15–41)
Anion gap: 11 (ref 5–15)
BILIRUBIN TOTAL: 0.7 mg/dL (ref 0.3–1.2)
BUN: 6 mg/dL (ref 6–20)
CALCIUM: 9.3 mg/dL (ref 8.9–10.3)
CO2: 28 mmol/L (ref 22–32)
Chloride: 101 mmol/L (ref 101–111)
Creatinine, Ser: 0.71 mg/dL (ref 0.61–1.24)
GFR calc Af Amer: >60 mL/min (ref >60)
GFR calc non Af Amer: >60 mL/min (ref >60)
Glucose, Bld: 98 mg/dL (ref 65–99)
Potassium: 3.5 mmol/L (ref 3.5–5.1)
Sodium: 140 mmol/L (ref 135–145)
TOTAL PROTEIN: 6.3 g/dL — AB (ref 6.5–8.1)

## 2016-10-28 LAB — HEPATITIS PANEL, ACUTE
HCV Ab: <0.1 s/co ratio (ref 0.0–0.9)
Hep A IgM: NEGATIVE
Hep B C IgM: NEGATIVE
Hepatitis B Surface Ag: NEGATIVE

## 2016-10-28 LAB — CBC
HEMATOCRIT: 29.3 % — AB (ref 39.0–52.0)
HEMOGLOBIN: 10.4 g/dL — AB (ref 13.0–17.0)
MCH: 33.8 pg (ref 26.0–34.0)
MCHC: 35.5 g/dL (ref 30.0–36.0)
MCV: 95.1 fL (ref 78.0–100.0)
Platelets: 54 10*3/uL — ABNORMAL LOW (ref 150–400)
RBC: 3.08 MIL/uL — ABNORMAL LOW (ref 4.22–5.81)
RDW: 13.8 % (ref 11.5–15.5)
WBC: 3.8 10*3/uL — ABNORMAL LOW (ref 4.0–10.5)

## 2016-10-28 LAB — DIC (DISSEMINATED INTRAVASCULAR COAGULATION) PANEL
APTT: 24 s (ref 24–36)
PLATELETS: 57 10*3/uL — AB (ref 150–400)

## 2016-10-28 LAB — GLUCOSE, CAPILLARY
Glucose-Capillary: 106 mg/dL — ABNORMAL HIGH (ref 65–99)
Glucose-Capillary: 178 mg/dL — ABNORMAL HIGH (ref 65–99)

## 2016-10-28 MED ORDER — CEPHALEXIN 500 MG PO CAPS: 500.0000 mg | ORAL_CAPSULE | Freq: Two times a day (BID) | ORAL | 0 refills | Status: AC

## 2016-10-28 MED ORDER — POTASSIUM CHLORIDE CRYS ER 20 MEQ PO TBCR
40.0000 meq | EXTENDED_RELEASE_TABLET | Freq: Once | ORAL | Status: AC
  Administered 2016-10-28: 40 meq via ORAL
  Filled 2016-10-28: qty 2

## 2016-10-28 MED ORDER — FLUCONAZOLE 200 MG PO TABS: 400.0000 mg | ORAL_TABLET | Freq: Every day | ORAL | 0 refills | Status: AC

## 2016-10-28 MED ORDER — DOCUSATE SODIUM 100 MG PO CAPS: 100.0000 mg | ORAL_CAPSULE | Freq: Two times a day (BID) | ORAL | 0 refills | Status: AC

## 2016-10-28 NOTE — Care Management Note (Addendum)
Case Management Note  Patient Details  Name: Casey Gibson MRN: [id number] Date of Birth: [birth year]  Subjective/Objective:                   Pt withmedical history significant of hypertension, obesity, psoriasis on prednisone/ methotrexate, diabetes mellitus presented  with worsening abdominal and sacral rash with drainage, oozing and pain. Now with pancytopenia especially thrombocytopenia.  PCP: Donneta Romberg  Action/Plan: Plan is to d/c to home today with home health services. Pt will be living @ the proceeding address:  [address] [place] ,  [place], [place] , [address], [phone].  Expected Discharge Date:  10/28/16               Expected Discharge Plan:  Home/Self Care  In-House Referral:     Discharge planning Services  CM Consult  Post Acute Care Choice:    Choice offered to:     DME Arranged:    DME Agency:     HH Arranged:  PT, OT, RN, Nurse's Aide HH Agency:  [facility]./ referral made with [place] @ [phone]  Status of Service:  Completed, signed off  If discussed at Long Length of Stay Meetings, dates discussed:    Additional Comments:  Epifanio Lesches, RN 10/28/2016, 3:19 PM

## 2016-10-28 NOTE — Progress Notes (Signed)
Pt has been discharge from the facility with no signs of distress. Pt verbalized discharge instructions.

## 2016-10-28 NOTE — Discharge Summary (Addendum)
Physician Discharge Summary  Casey Gibson MRN: [id number] DOB/AGE: [birth year] 71 y.o.  PCP: London Pepper, MD   Admit date: 10/23/2016 Discharge date: 10/28/2016  Discharge Diagnoses:    Active Problems:   Cellulitis   Pressure injury of skin   Pancytopenia (HCC)   Thrush   Psoriasis   Metabolic acidosis, increased anion gap (IAG)   Lactic acidosis   Alcohol use    Follow-up recommendations Follow-up with PCP in 3-5 days , including all  additional recommended appointments as below Follow-up CBC, CMP in 3-5 days Patient would need outpatient follow-up with Volanda Napoleon, MD for his thrombocytopenia Patient will follow need outpatient follow-up with Dr. Kathlene November  Rheumatology , He would need to hold his methotrexate until then Antihypertensive medications held due to low blood pressure  Home health arranged for complex wound care Patient would benefit from outpatient dermatology evaluation asap     Current Discharge Medication List    START taking these medications   Details  cephALEXin (KEFLEX) 500 MG capsule Take 1 capsule (500 mg total) by mouth 2 (two) times daily. Qty: 28 capsule, Refills: 0    docusate sodium (COLACE) 100 MG capsule Take 1 capsule (100 mg total) by mouth 2 (two) times daily. Qty: 10 capsule, Refills: 0    fluconazole (DIFLUCAN) 200 MG tablet Take 2 tablets (400 mg total) by mouth daily. Qty: 28 tablet, Refills: 0      CONTINUE these medications which have NOT CHANGED   Details  amLODipine (NORVASC) 10 MG tablet Take 10 mg by mouth daily.    glipiZIDE (GLUCOTROL) 10 MG tablet Take 10 mg by mouth 2 (two) times daily.    metFORMIN (GLUCOPHAGE) 1000 MG tablet Take 1,000 mg by mouth 2 (two) times daily.    folic acid (FOLVITE) 1 MG tablet Take 1 mg by mouth daily.    predniSONE (DELTASONE) 5 MG tablet Take 5-30 mg by mouth daily with breakfast.      STOP taking these medications     aspirin EC 81 MG tablet       benazepril-hydrochlorthiazide (LOTENSIN HCT) 20-12.5 MG tablet      Methotrexate Sodium (METHOTREXATE, PF,) 50 MG/2ML injection      naproxen sodium (ANAPROX) 220 MG tablet          Discharge Condition: Prognosis guarded   Discharge instructions  Get Medicines reviewed and adjusted: Please take all your medications with you for your next visit with your Primary MD  Please request your Primary MD to go over all hospital tests and procedure/radiological results at the follow up, please ask your Primary MD to get all Hospital records sent to his/her office.  If you experience worsening of your admission symptoms, develop shortness of breath, life threatening emergency, suicidal or homicidal thoughts you must seek medical attention immediately by calling 911 or calling your MD immediately if symptoms less severe.  You must read complete instructions/literature along with all the possible adverse reactions/side effects for all the Medicines you take and that have been prescribed to you. Take any new Medicines after you have completely understood and accpet all the possible adverse reactions/side effects.   Do not drive when taking Pain medications.   Do not take more than prescribed Pain, Sleep and Anxiety Medications  Special Instructions: If you have smoked or chewed Tobacco in the last 2 yrs please stop smoking, stop any regular Alcohol and or any Recreational drug use.  Wear Seat belts while driving.  Please note  You were cared for by a hospitalist during your hospital stay. Once you are discharged, your primary care physician will handle any further medical issues. Please note that NO REFILLS for any discharge medications will be authorized once you are discharged, as it is imperative that you return to your primary care physician (or establish a relationship with a primary care physician if you do not have one) for your aftercare needs so that they can reassess your need for  medications and monitor your lab values.     Allergies  Allergen Reactions  . Simvastatin Other (See Comments)    REACTION: ankle swelling/fluid retention, myalgias  . Celecoxib Hives, Itching, Rash and Other (See Comments)  . Sulfonamide Derivatives Hives, Itching and Rash  . Penicillins Itching      Disposition: 01-Home or Self Care   Consults:    oncology Infectious disease    Significant Diagnostic Studies:  US Abdomen Complete  Result Date: 10/27/2016 CLINICAL DATA:  Heavy alcohol use for about 2 years, thrombocytopenia EXAM: ABDOMEN ULTRASOUND COMPLETE COMPARISON:  None. FINDINGS: Gallbladder: No gallstones or wall thickening visualized. No sonographic Murphy sign noted by sonographer. Common bile duct: Diameter: 5.6 mm in diameter within normal limits Liver: No focal lesion identified. Diffuse increased echogenicity of the liver suspicious for fatty infiltration. IVC: No abnormality visualized. Pancreas: Visualized portion unremarkable. Spleen: Size and appearance within normal limits. Measures 9.4 cm in length Right Kidney: Length: 11.9 cm. Echogenicity within normal limits. No mass or hydronephrosis visualized. Left Kidney: Length: 12.3 cm. Echogenicity within normal limits. No mass or hydronephrosis visualized. Abdominal aorta: No aneurysm visualized. Measures up to 2.7 cm in diameter. Other findings: None. IMPRESSION: 1. No gallstones are noted within gallbladder.  Normal CBD 2. Diffuse increased echogenicity of the liver suspicious for fatty infiltration. No focal hepatic mass. 3. No hydronephrosis. 4. No aortic aneurysm. Electronically Signed   By: Lahoma Crocker M.D.   On: 10/27/2016 07:59        Filed Weights   10/23/16 1500 10/23/16 1829  Weight: 126.1 kg (278 lb) 124.7 kg (275 lb)     Microbiology: Recent Results (from the past 240 hour(s))  Blood culture (routine x 2)     Status: None (Preliminary result)   Collection Time: 10/23/16  5:08 PM  Result Value Ref  Range Status   Specimen Description BLOOD LEFT HAND  Final   Special Requests   Final    BOTTLES DRAWN AEROBIC ONLY Blood Culture adequate volume   Culture NO GROWTH 4 DAYS  Final   Report Status PENDING  Incomplete  Blood culture (routine x 2)     Status: None (Preliminary result)   Collection Time: 10/23/16  6:24 PM  Result Value Ref Range Status   Specimen Description BLOOD LEFT HAND  Final   Special Requests   Final    BOTTLES DRAWN AEROBIC ONLY Blood Culture results may not be optimal due to an inadequate volume of blood received in culture bottles   Culture NO GROWTH 4 DAYS  Final   Report Status PENDING  Incomplete       Blood Culture    Component Value Date/Time   SDES BLOOD LEFT HAND 10/23/2016 1824   SPECREQUEST  10/23/2016 1824    BOTTLES DRAWN AEROBIC ONLY Blood Culture results may not be optimal due to an inadequate volume of blood received in culture bottles   CULT NO GROWTH 4 DAYS 10/23/2016 1824   REPTSTATUS PENDING 10/23/2016 1824  Labs: Results for orders placed or performed during the hospital encounter of 10/23/16 (from the past 48 hour(s))  Glucose, capillary     Status: Abnormal   Collection Time: 10/26/16 12:20 PM  Result Value Ref Range   Glucose-Capillary 163 (H) 65 - 99 mg/dL  Glucose, capillary     Status: Abnormal   Collection Time: 10/26/16  5:19 PM  Result Value Ref Range   Glucose-Capillary 190 (H) 65 - 99 mg/dL  Glucose, capillary     Status: Abnormal   Collection Time: 10/26/16 10:05 PM  Result Value Ref Range   Glucose-Capillary 108 (H) 65 - 99 mg/dL  DIC (disseminated intravasc coag) panel     Status: Abnormal   Collection Time: 10/27/16 12:25 AM  Result Value Ref Range   Prothrombin Time 14.4 11.4 - 15.2 seconds   INR 1.12    aPTT 29 24 - 36 seconds   Fibrinogen 582 (H) 210 - 475 mg/dL   D-Dimer, Quant 0.66 (H) 0.00 - 0.50 ug/mL-FEU    Comment: (NOTE) At the manufacturer cut-off of 0.50 ug/mL FEU, this assay has  been documented to exclude PE with a sensitivity and negative predictive value of 97 to 99%.  At this time, this assay has not been approved by the FDA to exclude DVT/VTE. Results should be correlated with clinical presentation.    Platelets 53 (L) 150 - 400 K/uL    Comment: CONSISTENT WITH PREVIOUS RESULT   Smear Review NO SCHISTOCYTES SEEN   CBC with Differential/Platelet     Status: Abnormal   Collection Time: 10/27/16 12:25 AM  Result Value Ref Range   WBC 3.8 (L) 4.0 - 10.5 K/uL   RBC 2.98 (L) 4.22 - 5.81 MIL/uL   Hemoglobin 10.0 (L) 13.0 - 17.0 g/dL   HCT 28.3 (L) 39.0 - 52.0 %   MCV 95.0 78.0 - 100.0 fL   MCH 33.6 26.0 - 34.0 pg   MCHC 35.3 30.0 - 36.0 g/dL   RDW 13.7 11.5 - 15.5 %   Platelets 55 (L) 150 - 400 K/uL    Comment: CONSISTENT WITH PREVIOUS RESULT   Neutrophils Relative % 39 %   Neutro Abs 1.5 (L) 1.7 - 7.7 K/uL   Lymphocytes Relative 57 %   Lymphs Abs 2.2 0.7 - 4.0 K/uL   Monocytes Relative 3 %   Monocytes Absolute 0.1 0.1 - 1.0 K/uL   Eosinophils Relative 2 %   Eosinophils Absolute 0.1 0.0 - 0.7 K/uL   Basophils Relative 0 %   Basophils Absolute 0.0 0.0 - 0.1 K/uL  Glucose, capillary     Status: None   Collection Time: 10/27/16  7:57 AM  Result Value Ref Range   Glucose-Capillary 92 65 - 99 mg/dL  CBC     Status: Abnormal   Collection Time: 10/27/16  8:52 AM  Result Value Ref Range   WBC 3.1 (L) 4.0 - 10.5 K/uL   RBC 3.04 (L) 4.22 - 5.81 MIL/uL   Hemoglobin 10.3 (L) 13.0 - 17.0 g/dL   HCT 29.1 (L) 39.0 - 52.0 %   MCV 95.7 78.0 - 100.0 fL   MCH 33.9 26.0 - 34.0 pg   MCHC 35.4 30.0 - 36.0 g/dL   RDW 13.7 11.5 - 15.5 %   Platelets 50 (L) 150 - 400 K/uL    Comment: CONSISTENT WITH PREVIOUS RESULT  Comprehensive metabolic panel     Status: Abnormal   Collection Time: 10/27/16  8:52 AM  Result Value Ref Range  Sodium 139 135 - 145 mmol/L   Potassium 3.6 3.5 - 5.1 mmol/L   Chloride 100 (L) 101 - 111 mmol/L   CO2 27 22 - 32 mmol/L   Glucose, Bld 95  65 - 99 mg/dL   BUN 5 (L) 6 - 20 mg/dL   Creatinine, Ser 0.76 0.61 - 1.24 mg/dL   Calcium 9.3 8.9 - 10.3 mg/dL   Total Protein 6.6 6.5 - 8.1 g/dL   Albumin 2.9 (L) 3.5 - 5.0 g/dL   AST 49 (H) 15 - 41 U/L   ALT 54 17 - 63 U/L   Alkaline Phosphatase 71 38 - 126 U/L   Total Bilirubin 0.8 0.3 - 1.2 mg/dL   GFR calc non Af Amer >60 >60 mL/min   GFR calc Af Amer >60 >60 mL/min    Comment: (NOTE) The eGFR has been calculated using the CKD EPI equation. This calculation has not been validated in all clinical situations. eGFR's persistently <60 mL/min signify possible Chronic Kidney Disease.    Anion gap 12 5 - 15  HIV antibody     Status: None   Collection Time: 10/27/16  8:52 AM  Result Value Ref Range   HIV Screen 4th Generation wRfx Non Reactive Non Reactive    Comment: (NOTE) Performed At: [facility] [address] [place], [place] [id number] Lindon Romp MD WJ:[id number]   Hepatitis panel, acute     Status: None   Collection Time: 10/27/16  8:52 AM  Result Value Ref Range   Hepatitis B Surface Ag Negative Negative   HCV Ab <0.1 0.0 - 0.9 s/co ratio    Comment: (NOTE)                                  Negative:     < 0.8                             Indeterminate: 0.8 - 0.9                                  Positive:     > 0.9 The CDC recommends that a positive HCV antibody result be followed up with a HCV Nucleic Acid Amplification test (213086). Performed At: [facility] [place], [place] [id number] Lindon Romp MD [id number]    Hep A IgM Negative Negative   Hep B C IgM Negative Negative  Sedimentation rate     Status: Abnormal   Collection Time: 10/27/16  8:52 AM  Result Value Ref Range   Sed Rate 109 (H) 0 - 16 mm/hr  C-reactive protein     Status: Abnormal   Collection Time: 10/27/16  8:52 AM  Result Value Ref Range   CRP 3.6 (H) <1.0 mg/dL  Glucose, capillary     Status: Abnormal   Collection Time: 10/27/16 12:06 PM   Result Value Ref Range   Glucose-Capillary 168 (H) 65 - 99 mg/dL  Glucose, capillary     Status: Abnormal   Collection Time: 10/27/16  5:05 PM  Result Value Ref Range   Glucose-Capillary 186 (H) 65 - 99 mg/dL  Glucose, capillary     Status: Abnormal   Collection Time: 10/27/16  9:59 PM  Result Value Ref Range   Glucose-Capillary 167 (H) 65 - 99  mg/dL  DIC (disseminated intravasc coag) panel     Status: Abnormal   Collection Time: 10/28/16  5:21 AM  Result Value Ref Range   Prothrombin Time 14.8 11.4 - 15.2 seconds   INR 1.15    aPTT 24 24 - 36 seconds   Fibrinogen 577 (H) 210 - 475 mg/dL   D-Dimer, Quant 0.87 (H) 0.00 - 0.50 ug/mL-FEU    Comment: (NOTE) At the manufacturer cut-off of 0.50 ug/mL FEU, this assay has been documented to exclude PE with a sensitivity and negative predictive value of 97 to 99%.  At this time, this assay has not been approved by the FDA to exclude DVT/VTE. Results should be correlated with clinical presentation.    Platelets 57 (L) 150 - 400 K/uL    Comment: CONSISTENT WITH PREVIOUS RESULT   Smear Review NO SCHISTOCYTES SEEN   CBC     Status: Abnormal   Collection Time: 10/28/16  5:21 AM  Result Value Ref Range   WBC 3.8 (L) 4.0 - 10.5 K/uL   RBC 3.08 (L) 4.22 - 5.81 MIL/uL   Hemoglobin 10.4 (L) 13.0 - 17.0 g/dL    Comment: CONSISTENT WITH PREVIOUS RESULT   HCT 29.3 (L) 39.0 - 52.0 %   MCV 95.1 78.0 - 100.0 fL   MCH 33.8 26.0 - 34.0 pg   MCHC 35.5 30.0 - 36.0 g/dL   RDW 13.8 11.5 - 15.5 %   Platelets 54 (L) 150 - 400 K/uL    Comment: CONSISTENT WITH PREVIOUS RESULT  Comprehensive metabolic panel     Status: Abnormal   Collection Time: 10/28/16  5:21 AM  Result Value Ref Range   Sodium 140 135 - 145 mmol/L   Potassium 3.5 3.5 - 5.1 mmol/L   Chloride 101 101 - 111 mmol/L   CO2 28 22 - 32 mmol/L   Glucose, Bld 98 65 - 99 mg/dL   BUN 6 6 - 20 mg/dL   Creatinine, Ser 0.71 0.61 - 1.24 mg/dL   Calcium 9.3 8.9 - 10.3 mg/dL   Total Protein 6.3  (L) 6.5 - 8.1 g/dL   Albumin 2.8 (L) 3.5 - 5.0 g/dL   AST 36 15 - 41 U/L   ALT 43 17 - 63 U/L   Alkaline Phosphatase 73 38 - 126 U/L   Total Bilirubin 0.7 0.3 - 1.2 mg/dL   GFR calc non Af Amer >60 >60 mL/min   GFR calc Af Amer >60 >60 mL/min    Comment: (NOTE) The eGFR has been calculated using the CKD EPI equation. This calculation has not been validated in all clinical situations. eGFR's persistently <60 mL/min signify possible Chronic Kidney Disease.    Anion gap 11 5 - 15  Glucose, capillary     Status: Abnormal   Collection Time: 10/28/16  7:53 AM  Result Value Ref Range   Glucose-Capillary 106 (H) 65 - 99 mg/dL     Lipid Panel     Component Value Date/Time   CHOL 190 06/26/2010 1535   TRIG 109.0 06/26/2010 1535   HDL 60.10 06/26/2010 1535   CHOLHDL 3 06/26/2010 1535   VLDL 21.8 06/26/2010 1535   LDLCALC 108 (H) 06/26/2010 1535   LDLDIRECT 99.5 11/19/2009 1355     Lab Results  Component Value Date   HGBA1C 6.5 (H) 10/23/2016   HGBA1C (H) 08/25/2010    7.2 (NOTE)  According to the ADA Clinical Practice Recommendations for 2011, when HbA1c is used as a screening test:   >=6.5%   Diagnostic of Diabetes Mellitus           (if abnormal result  is confirmed)  5.7-6.4%   Increased risk of developing Diabetes Mellitus  References:Diagnosis and Classification of Diabetes Mellitus,Diabetes YJEH,6314,97(WYOVZ 1):S62-S69 and Standards of Medical Care in         Diabetes - 2011,Diabetes CHYI,5027,74  (Suppl 1):S11-S61.   HGBA1C 5.7 06/26/2010     Lab Results  Component Value Date   MICROALBUR 0.8 05/27/2009   LDLCALC 108 (H) 06/26/2010   CREATININE 0.71 10/28/2016     HPI :  71 y.o.malewith medical history significant of hypertension, obesity, psoriasis on prednisone and weekly methotrexate, diabetes mellitus presented to the hospital for the evaluation of worsening abdominal and sacral rash with  drainage, oozing and pain. Patient was found to have cellulitis of the groin area. Presenting CBG greater than 500. Empirically started on vancomycin/Zosyn/for consult Now with pancytopenia especially thrombocytopenia  HOSPITAL COURSE:    # Abdomen wall and peri-anal cellulitis, possibly superimposed fungal infection: Patient is immunocompromised because of methotrexate, prednisone and history of diabetes. The blisters are open and has drainage. Initially placed on vancomycin and Zosyn Broad-spectrum antibiotics discontinued, infectious disease consulted and patient switched to Ancef, and fluconazole   Wound care was consulted, Follow wound care recommendations, home health RN arranged Follow blood cultures from 4/6-no growth so far changed to oral Keflex and oral fluconazole and he can continue both for a total of 2 weeks of therapy   #Type 2 DM with hyperglycemia and possible mild diabetic ketoacidosis without coma. CBG 532  on admission. Anion gap 16 -Patient takes oral medication at home. Resume home medications. Hemoglobin A1c 6.5   #Acute kidney injury in the setting of hyperglycemia, dehydration and NSAIDs use: UA unremarkable. Creatinine had increased to 1.35, now 0.71. Serum creatinine level already improved. Off IV fluids since patient has good oral intake. Monitor BMP. Avoid nephrotoxins.   #Hyponatremia likely in the setting of hyperglycemia and hydrochlorothiazide: Serum sodium level improved to 133 >136. Hyperglycemia improving. Monitor serum potassium level. Continue to hold hydrochlorothiazide.  #History of psoriasis follows up with rheumatologist outpatient:Dr Lahoma Rocker. Continue home dose of prednisone. Patient is not in  distress or septic therefore will not order stress dose of steroid. Patient takes weekly methotrexate which has been held due to pancytopenia. Infectious disease also feels that the patient's oral ulcers secondary to methotrexate  #Leukopenia and  thrombocytopenia likely contributed by methotrexate. No sign of bleeding. Monitor CBC. Platelet count dropped to 96>21 hematology consulted. Status post 1 unit of platelets. Platelet count has improved to 50>55. Thrombocytopenia likely medication induced. No indication for bone marrow biopsy. Appreciate hematology oncology Dr.Ennever. Patient likely to follow-up with him in the outpatient setting  #Hypokalemia-repleted  # Anemia of chronic disease /ABLA  Baseline 12.2, slow oozing , hemoglobin remaining stable. No indication for PRBC transfusion at this time   Discharge Exam:  Blood pressure 130/68, pulse 73, temperature 99.1 F (37.3 C), temperature source Oral, resp. rate 18, height 5' 8"  (1.727 m), weight 124.7 kg (275 lb), SpO2 100 %. General exam: Appears calm and comfortable  Respiratory system: Clear to auscultation. Respiratory effort normal. No wheezing or crackle Cardiovascular system: S1 & S2 heard, RRR.  No pedal edema. Gastrointestinal system: Abdomen pannus has open wound with no drainage today., Open wound around the perianal area. Central nervous  system: Alert and oriented. No focal neurological deficits. Extremities: Symmetric 5 x 5 power. Skin:  dermatitis with partial and full thickness skin loss in the subpannicular region, the bilateral inguinal regions, the right scrotal area and the intergluteal cleft     Follow-up Information    London Pepper, MD. Call.   Specialty:  Family Medicine Why:  Hospital follow-up in 3-5 days Contact information: [place] [address] [phone]        Volanda Napoleon, MD. Call.   Specialty:  Oncology Why:  hospital  follow in 3-5 days, follow hemoglobin and platelets Contact information: [address] [place] [address] [phone]        Lahoma Rocker, MD. Call.   Specialty:  Rheumatology Why:  To follow-up with hematology, discuss treatment of psoriasis,  dermatology referral? Contact information: [address] [place] [address] [phone]           Signed: Reyne Dumas 10/28/2016, 10:08 AM        Time spent >45 mins

## 2016-11-18 ENCOUNTER — Other Ambulatory Visit: Payer: Self-pay | Admitting: Family

## 2016-11-18 ENCOUNTER — Ambulatory Visit: Payer: Medicare Other

## 2016-11-18 ENCOUNTER — Other Ambulatory Visit (HOSPITAL_BASED_OUTPATIENT_CLINIC_OR_DEPARTMENT_OTHER): Payer: Medicare Other

## 2016-11-18 ENCOUNTER — Ambulatory Visit (HOSPITAL_BASED_OUTPATIENT_CLINIC_OR_DEPARTMENT_OTHER): Payer: Medicare Other | Admitting: Family

## 2016-11-18 VITALS — BP 118/59 | HR 113 | Temp 98.4°F | Resp 17 | Wt 266.8 lb

## 2016-11-18 DIAGNOSIS — D696 Thrombocytopenia, unspecified: Secondary | ICD-10-CM

## 2016-11-18 LAB — CBC WITH DIFFERENTIAL (CANCER CENTER ONLY)
BASO#: 0.1 10*3/uL (ref 0.0–0.2)
BASO%: 0.6 % (ref 0.0–2.0)
EOS ABS: 0 10*3/uL (ref 0.0–0.5)
EOS%: 0.3 % (ref 0.0–7.0)
HCT: 35.7 % — ABNORMAL LOW (ref 38.7–49.9)
HEMOGLOBIN: 12.6 g/dL — AB (ref 13.0–17.1)
LYMPH#: 3.1 10*3/uL (ref 0.9–3.3)
LYMPH%: 29.2 % (ref 14.0–48.0)
MCH: 34.1 pg — AB (ref 28.0–33.4)
MCHC: 35.3 g/dL (ref 32.0–35.9)
MCV: 97 fL (ref 82–98)
MONO#: 1.1 10*3/uL — AB (ref 0.1–0.9)
MONO%: 10 % (ref 0.0–13.0)
NEUT#: 6.3 10*3/uL (ref 1.5–6.5)
NEUT%: 59.9 % (ref 40.0–80.0)
Platelets: 266 10*3/uL (ref 145–400)
RBC: 3.7 10*6/uL — AB (ref 4.20–5.70)
RDW: 13.8 % (ref 11.1–15.7)
WBC: 10.5 10*3/uL — AB (ref 4.0–10.0)

## 2016-11-18 LAB — COMPREHENSIVE METABOLIC PANEL
ALBUMIN: 3.8 g/dL (ref 3.5–5.0)
ALK PHOS: 82 U/L (ref 40–150)
ALT: 25 U/L (ref 0–55)
ANION GAP: 17 meq/L — AB (ref 3–11)
AST: 40 U/L — ABNORMAL HIGH (ref 5–34)
BILIRUBIN TOTAL: 0.28 mg/dL (ref 0.20–1.20)
BUN: 16 mg/dL (ref 7.0–26.0)
CO2: 19 mEq/L — ABNORMAL LOW (ref 22–29)
CREATININE: 1.2 mg/dL (ref 0.7–1.3)
Calcium: 9.7 mg/dL (ref 8.4–10.4)
Chloride: 103 mEq/L (ref 98–109)
EGFR: 73 mL/min/{1.73_m2} — AB (ref >90)
GLUCOSE: 229 mg/dL — AB (ref 70–140)
Potassium: 4.6 mEq/L (ref 3.5–5.1)
Sodium: 139 mEq/L (ref 136–145)
TOTAL PROTEIN: 7.9 g/dL (ref 6.4–8.3)

## 2016-11-18 LAB — TECHNOLOGIST REVIEW CHCC SATELLITE

## 2016-11-18 LAB — LACTATE DEHYDROGENASE: LDH: 216 U/L (ref 125–245)

## 2016-11-18 NOTE — Progress Notes (Signed)
Hematology/Oncology Consultation   Name: Casey Gibson      MRN: [id number]    Location: Room/bed info not found  Date: 11/18/2016 Time:1:15 PM   REFERRING PHYSICIAN: Hospital   REASON FOR CONSULT: Thrombocytopenia    DIAGNOSIS: Thrombocytopenia - medication induced   HISTORY OF PRESENT ILLNESS: Casey Gibson is a very pleasant 71 yo African American gentleman with history of psoriasis and treatment with Methotrexate. He was hospitalized for several days earlier this month with cellulitis and thrombocytosis induced by methotrexate.  The cellulitis has since resolved. He has stopped the methotrexate and finished his prednisone 2 weeks ago. Hgb is stable at 12.6 with a platelet count of 266. His WBC count is 10.5.  He states that he has an appointment to follow-up with Rheumatologist Casey Gibson.  No fever, chills, n/v, cough, rash, dizziness, headache, vision changes, SOB, chest pain, palpitations, abdominal pain or changes in bowel or bladder habits.  No episodes of bleeding, bruising or petechia. No lymphadenopathy found on exam.  The puffiness in his ankles and feet that comes and goes. This improves when her elevates his feet.  He feels that the occasional numbness and tingling in hid  His [place] on 4/10 showed some fatty infiltration. He is working hard to lose weight by cutting out alcohol and watching his portions. He is staying well hydrated. His weight is down 9 lbs since last month.  He states that he stopped drinking alcoholic beverages 6 weeks ago and is only smoking 2 cigars a day now.  He states that his blood sugars are up and down. He takes Glipizide and Metformin daily.   ROS: All other 10 point review of systems is negative.   PAST MEDICAL HISTORY:   Past Medical History:  Diagnosis Date  . Diabetes mellitus without complication (HCC)   . Hypertension   . Psoriasis     ALLERGIES: Allergies  Allergen Reactions  . Simvastatin Other (See Comments)    REACTION: ankle  swelling/fluid retention, myalgias  . Celecoxib Hives, Itching, Rash and Other (See Comments)  . Sulfonamide Derivatives Hives, Itching and Rash  . Penicillins Itching      MEDICATIONS:  Current Outpatient Prescriptions on File Prior to Visit  Medication Sig Dispense Refill  . amLODipine (NORVASC) 10 MG tablet Take 10 mg by mouth daily.    Marland Kitchen docusate sodium (COLACE) 100 MG capsule Take 1 capsule (100 mg total) by mouth 2 (two) times daily. 10 capsule 0  . glipiZIDE (GLUCOTROL) 10 MG tablet Take 10 mg by mouth 2 (two) times daily.    . metFORMIN (GLUCOPHAGE) 1000 MG tablet Take 1,000 mg by mouth 2 (two) times daily.     No current facility-administered medications on file prior to visit.      PAST SURGICAL HISTORY No past surgical history on file.  FAMILY HISTORY: No family history on file.  SOCIAL HISTORY:  reports that he has been smoking.  He has never used smokeless tobacco. He reports that he drinks alcohol. He reports that he does not use drugs.  PERFORMANCE STATUS: The patient's performance status is 0 - Asymptomatic  PHYSICAL EXAM: Most Recent Vital Signs: Blood pressure (!) 118/59, pulse (!) 113, temperature 98.4 F (36.9 C), temperature source Oral, resp. rate 17, weight 266 lb 12.8 oz (121 kg), SpO2 100 %. BP (!) 118/59 (BP Location: Left Arm, Patient Position: Sitting)   Pulse (!) 113   Temp 98.4 F (36.9 C) (Oral)   Resp 17   Wt 266  lb 12.8 oz (121 kg)   SpO2 100%   BMI 40.57 kg/m   General Appearance:    Alert, cooperative, no distress, appears stated age  Head:    Normocephalic, without obvious abnormality, atraumatic  Eyes:    PERRL, conjunctiva/corneas clear, EOM's intact, fundi    benign, both eyes             Throat:   Lips, mucosa, and tongue normal; teeth and gums normal  Neck:   Supple, symmetrical, trachea midline, no adenopathy;       thyroid:  No enlargement/tenderness/nodules; no carotid   bruit or JVD  Back:     Symmetric, no curvature,  ROM normal, no CVA tenderness  Lungs:     Clear to auscultation bilaterally, respirations unlabored  Chest wall:    No tenderness or deformity  Heart:    Regular rate and rhythm, S1 and S2 normal, no murmur, rub   or gallop  Abdomen:     Soft, non-tender, bowel sounds active all four quadrants,    no masses, no organomegaly        Extremities:   Extremities normal, atraumatic, no cyanosis or edema  Pulses:   2+ and symmetric all extremities  Skin:   Skin color, texture, turgor normal, no rashes or lesions  Lymph nodes:   Cervical, supraclavicular, and axillary nodes normal  Neurologic:   CNII-XII intact. Normal strength, sensation and reflexes      throughout    LABORATORY DATA:  Results for orders placed or performed in visit on 11/18/16 (from the past 48 hour(s))  CBC w/Diff     Status: Abnormal   Collection Time: 11/18/16 10:26 AM  Result Value Ref Range   WBC 10.5 (H) 4.0 - 10.0 10e3/uL   RBC 3.70 (L) 4.20 - 5.70 10e6/uL   HGB 12.6 (L) 13.0 - 17.1 g/dL   HCT 01.0 (L) 27.2 - 53.6 %   MCV 97 82 - 98 fL   MCH 34.1 (H) 28.0 - 33.4 pg   MCHC 35.3 32.0 - 35.9 g/dL   RDW 64.4 03.4 - 74.2 %   Platelets 266 145 - 400 10e3/uL   NEUT# 6.3 1.5 - 6.5 10e3/uL   LYMPH# 3.1 0.9 - 3.3 10e3/uL   MONO# 1.1 (H) 0.1 - 0.9 10e3/uL   Eosinophils Absolute 0.0 0.0 - 0.5 10e3/uL   BASO# 0.1 0.0 - 0.2 10e3/uL   NEUT% 59.9 40.0 - 80.0 %   LYMPH% 29.2 14.0 - 48.0 %   MONO% 10.0 0.0 - 13.0 %   EOS% 0.3 0.0 - 7.0 %   BASO% 0.6 0.0 - 2.0 %  TECHNOLOGIST REVIEW [facility]     Status: None   Collection Time: 11/18/16 10:26 AM  Result Value Ref Range   Tech Review      Platelet count confirmed by slide estimate, 2% myelocytes      RADIOGRAPHY: No results found.     PATHOLOGY: None  ASSESSMENT/PLAN: Casey Gibson is a very pleasant 71 yo African American gentleman with recent episode of thrombocytopenia while on treatment with Methotrexate for psoriasis. He is off the methotrexate now and his  platelet count his normalized to 266. His Hgb is stable at 12,6 with an MCV of 97 and WBC count is 10.5. His cellulitis has resolved and he is doing well. He has no complaints at this time.  He will follow-up with rheumatology later this month to discuss other treatment options for his psoriasis.  At this point  we can let him go from our office and just follow-up if needed in the future.  All questions were answered. She will contact our office with any questions or concerns. We can certainly him for any future hematologic issues.   She was discussed with and also seen by Dr. Myna Hidalgo and he is in agreement with the aforementioned.   [facility]     Addendum:  I saw and examined the patient with Smriti Barkow. I saw him when he is not hospital. He had marked thrombocytopenia. I think the thrombocytopenia was a combination of factors related to his infection, methotrexate, possible alcohol use/cirrhosis and possibly sepsis.   We really did not do much for him. His platelet count improved nicely.  I  did look at his blood under the microscope. I do not see anything that looked suspicious. His platelets appeared quite mature. Platelets appeared with good granulation.  At this point, I does think we can follow him as necessary. I don't think we have to make a follow-up appointment for him. I does don't think that we are adding much to his medical care.  He is very nice. It is fun to talk with him.  We spent about 30 minutes with him.  Christin Bach, MD

## 2017-04-02 ENCOUNTER — Emergency Department (HOSPITAL_COMMUNITY)
Admission: EM | Admit: 2017-04-02 | Discharge: 2017-04-02 | Disposition: A | Discharge status: Home / Self Care | Payer: Medicare Other | Attending: Emergency Medicine | Admitting: Emergency Medicine

## 2017-04-02 ENCOUNTER — Emergency Department (HOSPITAL_COMMUNITY): Payer: Medicare Other

## 2017-04-02 ENCOUNTER — Encounter (HOSPITAL_COMMUNITY): Payer: Self-pay

## 2017-04-02 DIAGNOSIS — I10 Essential (primary) hypertension: Secondary | ICD-10-CM | POA: Diagnosis not present

## 2017-04-02 DIAGNOSIS — T148XXA Other injury of unspecified body region, initial encounter: Secondary | ICD-10-CM

## 2017-04-02 DIAGNOSIS — Z79899 Other long term (current) drug therapy: Secondary | ICD-10-CM | POA: Diagnosis not present

## 2017-04-02 DIAGNOSIS — S46911A Strain of unspecified muscle, fascia and tendon at shoulder and upper arm level, right arm, initial encounter: Secondary | ICD-10-CM | POA: Insufficient documentation

## 2017-04-02 DIAGNOSIS — Z7984 Long term (current) use of oral hypoglycemic drugs: Secondary | ICD-10-CM | POA: Insufficient documentation

## 2017-04-02 DIAGNOSIS — Y92410 Unspecified street and highway as the place of occurrence of the external cause: Secondary | ICD-10-CM | POA: Diagnosis not present

## 2017-04-02 DIAGNOSIS — F17200 Nicotine dependence, unspecified, uncomplicated: Secondary | ICD-10-CM | POA: Diagnosis not present

## 2017-04-02 DIAGNOSIS — Y998 Other external cause status: Secondary | ICD-10-CM | POA: Diagnosis not present

## 2017-04-02 DIAGNOSIS — Y939 Activity, unspecified: Secondary | ICD-10-CM | POA: Diagnosis not present

## 2017-04-02 DIAGNOSIS — E119 Type 2 diabetes mellitus without complications: Secondary | ICD-10-CM | POA: Insufficient documentation

## 2017-04-02 MED ORDER — ACETAMINOPHEN 325 MG PO TABS
650.0000 mg | ORAL_TABLET | Freq: Once | ORAL | Status: AC
  Administered 2017-04-02: 650 mg via ORAL
  Filled 2017-04-02: qty 2
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
## 2017-06-15 ENCOUNTER — Other Ambulatory Visit: Payer: Self-pay

## 2017-06-15 DIAGNOSIS — I739 Peripheral vascular disease, unspecified: Secondary | ICD-10-CM

## 2017-07-28 ENCOUNTER — Encounter: Payer: Self-pay | Admitting: Vascular Surgery

## 2017-07-28 ENCOUNTER — Ambulatory Visit: Payer: Medicare Other | Admitting: Vascular Surgery

## 2017-07-28 ENCOUNTER — Ambulatory Visit (HOSPITAL_COMMUNITY)
Admission: RE | Admit: 2017-07-28 | Discharge: 2017-07-28 | Disposition: A | Discharge status: Home / Self Care | Payer: Medicare Other | Source: Ambulatory Visit | Attending: Vascular Surgery | Admitting: Vascular Surgery

## 2017-07-28 VITALS — BP 130/79 | HR 92 | Temp 98.4°F | Resp 20 | Ht 68.0 in | Wt 269.0 lb

## 2017-07-28 DIAGNOSIS — I739 Peripheral vascular disease, unspecified: Secondary | ICD-10-CM

## 2017-07-28 NOTE — Progress Notes (Signed)
Patient name: Casey Gibson MRN: [id number] DOB: 10/21/1945 Sex: male   REASON FOR CONSULT:    Peripheral vascular disease.  The  consult is requested by Dr. Manon HildingBodea.   HPI:   Casey HatterLarry D Treadway is a pleasant 72 y.o. male, who is referred for evaluation of peripheral vascular disease. I did review the records that were sent from the referring office.  Patient was being seen with an 8-year history of low back pain.  Based on the exam it was felt that the patient may have some underlying peripheral vascular disease and the patient was sent for vascular consultation.  On physical exam at the referring office they were unable to palpate a right dorsalis pedis pulse.  The patient apparently has been diagnosed with sciatica.  I do not get any clear-cut history of claudication or rest pain.  He has no history of nonhealing ulcers.  He does have a history of psoriasis in both legs.  He denies any previous history of DVT or phlebitis.  Past Medical History:  Diagnosis Date  . Diabetes mellitus without complication (HCC)   . Hypertension   . Psoriasis     History reviewed. No pertinent family history.  He denies any family history of premature cardiovascular disease  SOCIAL HISTORY: He does smoke cigars.  He does not smoke cigarettes. Social History   Socioeconomic History  . Marital status: Married    Spouse name: Not on file  . Number of children: Not on file  . Years of education: Not on file  . Highest education level: Not on file  Social Needs  . Financial resource strain: Not on file  . Food insecurity - worry: Not on file  . Food insecurity - inability: Not on file  . Transportation needs - medical: Not on file  . Transportation needs - non-medical: Not on file  Occupational History  . Not on file  Tobacco Use  . Smoking status: Current Every Day Smoker    Years: 30.00    Types: Cigars  . Smokeless tobacco: Never Used  Substance and Sexual Activity  . Alcohol use: Yes    . Drug use: No  . Sexual activity: Not on file  Other Topics Concern  . Not on file  Social History Narrative  . Not on file    Allergies  Allergen Reactions  . Simvastatin Other (See Comments)    REACTION: ankle swelling/fluid retention, myalgias  . Celecoxib Hives, Itching, Rash and Other (See Comments)  . Sulfonamide Derivatives Hives, Itching and Rash  . Penicillins Itching    Current Outpatient Medications  Medication Sig Dispense Refill  . amLODipine (NORVASC) 10 MG tablet Take 10 mg by mouth daily.    Marland Kitchen. aspirin EC 81 MG tablet Take 81 mg by mouth daily.    . benazepril-hydrochlorthiazide (LOTENSIN HCT) 20-12.5 MG tablet TK 1 T PO  ONCE A DAY  0  . glipiZIDE (GLUCOTROL) 10 MG tablet Take 10 mg by mouth 2 (two) times daily.    . metFORMIN (GLUCOPHAGE) 1000 MG tablet Take 1,000 mg by mouth 2 (two) times daily.    Marland Kitchen. oxyCODONE-acetaminophen (PERCOCET/ROXICET) 5-325 MG tablet TK 1 T PO TID UTD  0  . docusate sodium (COLACE) 100 MG capsule Take 1 capsule (100 mg total) by mouth 2 (two) times daily. (Patient not taking: Reported on 07/28/2017) 10 capsule 0   No current facility-administered medications for this visit.     REVIEW OF SYSTEMS:  [X]  denotes positive  finding, [ ]  denotes negative finding Cardiac  Comments:  Chest pain or chest pressure:    Shortness of breath upon exertion:    Short of breath when lying flat:    Irregular heart rhythm:        Vascular    Pain in calf, thigh, or hip brought on by ambulation: X   Pain in feet at night that wakes you up from your sleep:     Blood clot in your veins:    Leg swelling:  X       Pulmonary    Oxygen at home:    Productive cough:     Wheezing:         Neurologic    Sudden weakness in arms or legs:     Sudden numbness in arms or legs:  X   Sudden onset of difficulty speaking or slurred speech:    Temporary loss of vision in one eye:     Problems with dizziness:         Gastrointestinal    Blood in stool:      Vomited blood:         Genitourinary    Burning when urinating:     Blood in urine:        Psychiatric    Major depression:         Hematologic    Bleeding problems:    Problems with blood clotting too easily:        Skin    Rashes or ulcers:        Constitutional    Fever or chills:     PHYSICAL EXAM:   Vitals:   07/28/17 1245  BP: 130/79  Pulse: 92  Resp: 20  Temp: 98.4 F (36.9 C)  TempSrc: Oral  SpO2: 99%  Weight: 269 lb (122 kg)  Height: 5\' 8"  (1.727 m)   GENERAL: The patient is a well-nourished male, in no acute distress. The vital signs are documented above. CARDIAC: There is a regular rate and rhythm.  VASCULAR: I do not detect carotid bruits. He has palpable femoral pulses bilaterally.  I can palpate a left dorsalis pedis pulse but cannot palpate a right dorsalis pedis pulse. Both feet are warm and well-perfused. He has hyperpigmentation bilaterally consistent with chronic venous insufficiency. He has mild bilateral lower extremity swelling. PULMONARY: There is good air exchange bilaterally without wheezing or rales. ABDOMEN: Soft and non-tender with normal pitched bowel sounds.  MUSCULOSKELETAL: There are no major deformities or cyanosis. NEUROLOGIC: No focal weakness or paresthesias are detected. SKIN:  He does have psoriasis in both legs. PSYCHIATRIC: The patient has a normal affect.  DATA:    ARTERIAL DOPPLER STUDY: I have independently interpreted his arterial Doppler study today.  On the right side there is a triphasic dorsalis pedis and posterior tibial signal.  The arteries are noncompressible so an ABI could not be obtained.  However the toe pressure is 122 mmHg.  On the left side there is a triphasic dorsalis pedis and posterior tibial signal.  ABI is over 100%.  This is likely falsely elevated because of calcific disease.  Toe pressure on the left is 124 mmHg.  MEDICAL ISSUES:   ABSENT RIGHT DORSALIS PEDIS PULSE: I agree that I am unable  to palpate a right dorsalis pedis pulse on exam.  However, his noninvasive studies show triphasic Doppler signals in both feet with normal toe pressures.  Thus I do not think he has any significant  arterial insufficiency.  Likewise he has no claudication or rest pain to suggest peripheral vascular disease.  CHRONIC VENOUS INSUFFICIENCY: He does have evidence of chronic venous insufficiency on exam however.  We have discussed the importance of intermittent leg elevation and the proper positioning for this.  I have encouraged him to avoid prolonged sitting and standing.  We also discussed water aerobics which I think is also very helpful for patients with chronic venous insufficiency.  I will be happy to see him back in the future if any new vascular issues arise.  Waverly Ferrari [facility] [phone]

## 2018-01-21 ENCOUNTER — Encounter (HOSPITAL_COMMUNITY): Payer: Self-pay

## 2018-01-21 ENCOUNTER — Emergency Department (HOSPITAL_COMMUNITY)
Admission: EM | Admit: 2018-01-21 | Discharge: 2018-01-21 | Disposition: A | Discharge status: Home / Self Care | Payer: Medicare Other | Attending: Emergency Medicine | Admitting: Emergency Medicine

## 2018-01-21 ENCOUNTER — Other Ambulatory Visit: Payer: Self-pay

## 2018-01-21 ENCOUNTER — Emergency Department (HOSPITAL_COMMUNITY): Payer: Medicare Other

## 2018-01-21 DIAGNOSIS — F17290 Nicotine dependence, other tobacco product, uncomplicated: Secondary | ICD-10-CM | POA: Insufficient documentation

## 2018-01-21 DIAGNOSIS — M542 Cervicalgia: Secondary | ICD-10-CM | POA: Diagnosis not present

## 2018-01-21 DIAGNOSIS — E119 Type 2 diabetes mellitus without complications: Secondary | ICD-10-CM | POA: Diagnosis not present

## 2018-01-21 DIAGNOSIS — Z7984 Long term (current) use of oral hypoglycemic drugs: Secondary | ICD-10-CM | POA: Diagnosis not present

## 2018-01-21 DIAGNOSIS — Y9301 Activity, walking, marching and hiking: Secondary | ICD-10-CM | POA: Diagnosis not present

## 2018-01-21 DIAGNOSIS — W19XXXA Unspecified fall, initial encounter: Secondary | ICD-10-CM

## 2018-01-21 DIAGNOSIS — Y999 Unspecified external cause status: Secondary | ICD-10-CM | POA: Diagnosis not present

## 2018-01-21 DIAGNOSIS — M25522 Pain in left elbow: Secondary | ICD-10-CM | POA: Diagnosis not present

## 2018-01-21 DIAGNOSIS — Z79899 Other long term (current) drug therapy: Secondary | ICD-10-CM | POA: Diagnosis not present

## 2018-01-21 DIAGNOSIS — I10 Essential (primary) hypertension: Secondary | ICD-10-CM | POA: Insufficient documentation

## 2018-01-21 DIAGNOSIS — W010XXA Fall on same level from slipping, tripping and stumbling without subsequent striking against object, initial encounter: Secondary | ICD-10-CM | POA: Insufficient documentation

## 2018-01-21 DIAGNOSIS — R0789 Other chest pain: Secondary | ICD-10-CM | POA: Diagnosis not present

## 2018-01-21 DIAGNOSIS — M25532 Pain in left wrist: Secondary | ICD-10-CM | POA: Insufficient documentation

## 2018-01-21 DIAGNOSIS — Y929 Unspecified place or not applicable: Secondary | ICD-10-CM | POA: Diagnosis not present

## 2018-01-21 DIAGNOSIS — S42295A Other nondisplaced fracture of upper end of left humerus, initial encounter for closed fracture: Secondary | ICD-10-CM | POA: Diagnosis not present

## 2018-01-21 DIAGNOSIS — S4992XA Unspecified injury of left shoulder and upper arm, initial encounter: Secondary | ICD-10-CM | POA: Diagnosis present

## 2018-01-21 DIAGNOSIS — Z7982 Long term (current) use of aspirin: Secondary | ICD-10-CM | POA: Diagnosis not present

## 2018-01-21 MED ORDER — HYDROMORPHONE HCL 1 MG/ML IJ SOLN
1.0000 mg | Freq: Once | INTRAMUSCULAR | Status: AC
  Administered 2018-01-21: 1 mg via INTRAVENOUS
  Filled 2018-01-21: qty 1

## 2018-01-21 MED ORDER — OXYCODONE-ACETAMINOPHEN 7.5-325 MG PO TABS: 1.0000 | ORAL_TABLET | Freq: Three times a day (TID) | ORAL | 0 refills | Status: AC | PRN

## 2018-01-21 MED ORDER — ONDANSETRON HCL 4 MG/2ML IJ SOLN
4.0000 mg | Freq: Once | INTRAMUSCULAR | Status: AC
  Administered 2018-01-21: 4 mg via INTRAVENOUS
  Filled 2018-01-21: qty 2

## 2018-01-21 NOTE — Progress Notes (Signed)
Orthopedic Tech Progress Note Patient Details:  Casey Gibson 09/20/1945 [id number]  Ortho Devices Type of Ortho Device: Sling immobilizer Ortho Device/Splint Location: lue Ortho Device/Splint Interventions: Ordered, Application, Adjustment   Post Interventions Patient Tolerated: Well Instructions Provided: Care of device, Adjustment of device   Casey Gibson, Casey Gibson 01/21/2018, 11:02 PM

## 2018-01-21 NOTE — ED Notes (Signed)
Ortho tech at bedside 

## 2018-01-21 NOTE — ED Provider Notes (Signed)
Fall with left UE and neck pain No LOC, no head injury Pending imaging  X-rays are significant for fracture of left humeral head. Immobilizer provided. He has seen Dr. Otelia SergeantNitka in the past but asks also for referral. Plan is to see ortho to insure adequate healing.   Imaging was poor of the c-spine. On re-evaluation, the patient does not have midline cervical tenderness and does have left lateral neck tenderness. He denies neck pain with movement other than to the lateral area. No CT felt necessary.   His pain is well managed. He and wife are comfortable with discharge home.    Elpidio AnisUpstill, Asa Baudoin, PA-C 01/21/18 2305    Terrilee FilesButler, Michael C, MD 01/22/18 [phone]

## 2018-01-21 NOTE — Discharge Instructions (Signed)
Follow up with Dr. Otelia SergeantNitka (your former orthopedic) or with Dr. Aundria Rudogers (orthopedic on call) for management of your arm fracture. Take

## 2018-01-21 NOTE — ED Provider Notes (Signed)
[facility] EMERGENCY DEPARTMENT Provider Note   CSN: [id number] Arrival date & time: 01/21/18  2014     History   Chief Complaint Chief Complaint  Patient presents with  . Fall  . Arm Pain    HPI Casey Gibson is a 72 y.o. male.  72 year old male brought in by EMS for injuries with fall.  Patient was ambulating through the doorway while using his cane when he tripped on the door jam landing on his left side.  Patient denies hitting his head, denies loss of consciousness denies feeling weak or dizzy prior to his fall, does not take a blood thinner.  Patient complains of pain in his left side ribs, entire left upper extremity and shoulder, left side neck. No other injuries, complaints, concerns.      Past Medical History:  Diagnosis Date  . Diabetes mellitus without complication (HCC)   . Hypertension   . Psoriasis     Patient Active Problem List   Diagnosis Date Noted  . Thrombocytopenia (HCC) 10/28/2016  . Lactic acidosis   . Alcohol use   . Metabolic acidosis, increased anion gap (IAG)   . Psoriasis   . Pressure injury of skin 10/24/2016  . Pancytopenia (HCC)   . Thrush   . Cellulitis 10/23/2016  . Hyperglycemia   . Superinfection   . AKI (acute kidney injury) (HCC)   . DISC DISEASE, LUMBAR 06/26/2010  . SLEEP APNEA, OBSTRUCTIVE 12/06/2009  . INSOMNIA-SLEEP DISORDER-UNSPEC 11/19/2009  . HYPERSOMNIA 11/19/2009  . PERIPHERAL EDEMA 11/19/2009  . KNEE PAIN, BILATERAL 05/18/2008  . LEG PAIN, BILATERAL 05/18/2008  . DIABETES MELLITUS, TYPE II 06/06/2007  . HYPERLIPIDEMIA 06/06/2007  . HYPERTENSION 06/06/2007  . OSTEOARTHROSIS UNSPEC WHETHER GEN/LOC ANK&FOOT 06/06/2007    History reviewed. No pertinent surgical history.      Home Medications    Prior to Admission medications   Medication Sig Start Date End Date Taking? Authorizing Provider  amLODipine (NORVASC) 10 MG tablet Take 10 mg by mouth daily. 07/31/16  Yes [provider]  aspirin EC 81 MG tablet Take 81 mg by mouth daily.   Yes [provider]  benazepril-hydrochlorthiazide (LOTENSIN HCT) 20-12.5 MG tablet Take 1 tablet by mouth daily.   Yes [provider]  glipiZIDE (GLUCOTROL) 10 MG tablet Take 10 mg by mouth 2 (two) times daily. 09/04/16  Yes [provider]  metFORMIN (GLUCOPHAGE) 1000 MG tablet Take 1,000 mg by mouth 2 (two) times daily. 09/22/16  Yes [provider]  docusate sodium (COLACE) 100 MG capsule Take 1 capsule (100 mg total) by mouth 2 (two) times daily. Patient not taking: Reported on 07/28/2017 10/28/16   Richarda Overlie, MD  oxyCODONE-acetaminophen (PERCOCET) 7.5-325 MG tablet Take 1 tablet by mouth 3 (three) times daily as needed for severe pain.    [provider]    Family History No family history on file.  Social History Social History   Tobacco Use  . Smoking status: Current Every Day Smoker    Years: 30.00    Types: Cigars  . Smokeless tobacco: Never Used  Substance Use Topics  . Alcohol use: Yes  . Drug use: No     Allergies   Simvastatin; Celecoxib; Sulfonamide derivatives; and Penicillins   Review of Systems Review of Systems  Constitutional: Negative for fever.  Eyes: Negative for visual disturbance.  Respiratory: Negative for shortness of breath.   Cardiovascular: Negative for chest pain.  Gastrointestinal: Negative for abdominal pain.  Musculoskeletal:  Positive for arthralgias, myalgias and neck pain. Negative for back pain.  Skin: Negative for rash and wound.  Neurological: Negative for dizziness, weakness, light-headedness and headaches.  Hematological: Does not bruise/bleed easily.  Psychiatric/Behavioral: Negative for confusion.  All other systems reviewed and are negative.    Physical Exam Updated Vital Signs BP (!) 142/78   Pulse 93   Temp 98.4 F (36.9 C) (Oral)   Resp 11   Ht 5\' 8"  (1.727 m)   Wt 121.6 kg (268 lb)   SpO2 100%   BMI  40.75 kg/m   Physical Exam  Constitutional: He is oriented to person, place, and time. He appears well-developed and well-nourished. No distress.  HENT:  Head: Normocephalic and atraumatic.  Eyes: Pupils are equal, round, and reactive to light. EOM are normal.  Cardiovascular: Normal rate, regular rhythm, normal heart sounds and intact distal pulses.  No murmur heard. Pulmonary/Chest: Effort normal and breath sounds normal. He exhibits tenderness.  Diffuse left side chest wall tenderness without ecchymosis/crepitus     Abdominal: Soft. He exhibits no distension. There is no tenderness.  Musculoskeletal: He exhibits tenderness. He exhibits no deformity.       Left shoulder: He exhibits tenderness and laceration. He exhibits no deformity and normal pulse.       Left elbow: He exhibits decreased range of motion. He exhibits no swelling, no effusion, no deformity and no laceration. Tenderness found. Olecranon process tenderness noted. No medial epicondyle and no lateral epicondyle tenderness noted.       Left wrist: He exhibits decreased range of motion and tenderness. He exhibits no swelling, no effusion, no crepitus, no deformity and no laceration.       Back:       Left hand: He exhibits decreased range of motion and tenderness. He exhibits normal capillary refill, no deformity, no laceration and no swelling.  Diffuse left hand/wrist/posterior elbow/left shoulder pain without deformity or open wounds.  Neurological: He is alert and oriented to person, place, and time.  Skin: Skin is warm and dry. No rash noted.  Psychiatric: He has a normal mood and affect. His behavior is normal.  Nursing note and vitals reviewed.    ED Treatments / Results  Labs (all labs ordered are listed, but only abnormal results are displayed) Labs Reviewed - No data to display  EKG EKG Interpretation  Date/Time:  Friday January 21 2018 20:15:00 EDT Ventricular Rate:  94 PR Interval:    QRS  Duration: 80 QT Interval:  363 QTC Calculation: 454 R Axis:   24 Text Interpretation:  Sinus rhythm Atrial premature complex Prolonged PR interval Abnormal R-wave progression, early transition similar to prior, increased rate 4/18 Confirmed by Meridee Score [phone]) on 01/21/2018 8:32:15 PM   Radiology Dg Forearm Left  Result Date: 01/21/2018 CLINICAL DATA:  Larey Seat downstairs with extremity pain EXAM: LEFT FOREARM - 2 VIEW COMPARISON:  None. FINDINGS: No acute displaced fracture or malalignment. Soft tissues are unremarkable. IMPRESSION: No acute osseous abnormality. Electronically Signed   By: Jasmine Pang M.D.   On: 01/21/2018 22:08   Dg Shoulder Left  Result Date: 01/21/2018 CLINICAL DATA:  Pain after fall EXAM: LEFT SHOULDER - 2+ VIEW COMPARISON:  None. FINDINGS: No humeral head dislocation. AC joint appears intact. Acute comminuted fracture involving the neck of the humerus with fracture extension to the greater tuberosity. About 1/4 shaft with of displacement of distal fracture fragment away from midline. Slight separation of the fracture fragments. Arthritis at the left shoulder.  IMPRESSION: Acute comminuted and mildly displaced proximal humerus fracture. Electronically Signed   By: Jasmine PangKim  Fujinaga M.D.   On: 01/21/2018 22:12   Dg Humerus Left  Result Date: 01/21/2018 CLINICAL DATA:  Pain after fall down stairs EXAM: LEFT HUMERUS - 2+ VIEW COMPARISON:  None. FINDINGS: Mid to distal humerus appears intact. Acute mildly comminuted fracture involving the left humeral head and neck. IMPRESSION: Acute comminuted proximal humerus fracture. Electronically Signed   By: Jasmine PangKim  Fujinaga M.D.   On: 01/21/2018 22:10   Dg Hand Complete Left  Result Date: 01/21/2018 CLINICAL DATA:  Pain after fall down stairs EXAM: LEFT HAND - COMPLETE 3+ VIEW COMPARISON:  None. FINDINGS: Vascular calcifications. Diffuse cysts within the carpal bones. Widened scapholunate interval. No acute displaced fracture or malalignment.  Degenerative changes at the PIP joints. Intercarpal degenerative changes. IMPRESSION: 1. No acute osseous abnormality. 2. Intercarpal degenerative changes. Electronically Signed   By: Jasmine PangKim  Fujinaga M.D.   On: 01/21/2018 22:09    Procedures Procedures (including critical care time)  Medications Ordered in ED Medications  HYDROmorphone (DILAUDID) injection 1 mg (1 mg Intravenous Given 01/21/18 2204)  ondansetron (ZOFRAN) injection 4 mg (4 mg Intravenous Given 01/21/18 2204)     Initial Impression / Assessment and Plan / ED Course  I have reviewed the triage vital signs and the nursing notes.  Pertinent labs & imaging results that were available during my care of the patient were reviewed by me and considered in my medical decision making (see chart for details).  Clinical Course as of Jan 21 2214  Fri Jan 21, 2018  2215 Change in shift, care signed out to Upstill, PA-C, pending XR results.    [LM]    Clinical Course User Index [LM] Jeannie FendMurphy, Burnette Valenti A, PA-C    Final Clinical Impressions(s) / ED Diagnoses   Final diagnoses:  Fall, initial encounter    ED Discharge Orders    None       Alden HippMurphy, Sonam Huelsmann A, PA-C 01/21/18 2215    Terrilee FilesButler, Michael C, MD 01/22/18 [phone]

## 2018-01-21 NOTE — ED Notes (Signed)
Patient transported to X-ray 

## 2018-01-21 NOTE — ED Triage Notes (Signed)
Pt brought in by [facility] from home for mechanical fall down x2 stairs. Pt denies LOC. Pt c/o left shoulder and arm pain. 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 Pt also c/o intermittent sharp left sided chest pain that started following the fall. Pt A+Ox4, no deformity noted to pt arm. Pt endorses pain with palpation and movement of extremity.

## 2018-01-27 ENCOUNTER — Encounter (INDEPENDENT_AMBULATORY_CARE_PROVIDER_SITE_OTHER): Payer: Self-pay | Admitting: Surgery

## 2018-01-27 ENCOUNTER — Ambulatory Visit (INDEPENDENT_AMBULATORY_CARE_PROVIDER_SITE_OTHER): Payer: Self-pay

## 2018-01-27 ENCOUNTER — Ambulatory Visit (INDEPENDENT_AMBULATORY_CARE_PROVIDER_SITE_OTHER): Payer: Medicare Other | Admitting: Surgery

## 2018-01-27 VITALS — BP 151/80 | HR 117 | Ht 68.0 in | Wt 268.0 lb

## 2018-01-27 DIAGNOSIS — M25512 Pain in left shoulder: Secondary | ICD-10-CM | POA: Diagnosis not present

## 2018-01-27 DIAGNOSIS — S42295A Other nondisplaced fracture of upper end of left humerus, initial encounter for closed fracture: Secondary | ICD-10-CM

## 2018-01-27 MED ORDER — TRAMADOL HCL 50 MG PO TABS: 50.0000 mg | ORAL_TABLET | Freq: Three times a day (TID) | ORAL | 0 refills | Status: AC | PRN

## 2018-01-27 NOTE — Progress Notes (Signed)
Office Visit Note   Patient: Casey Gibson           Date of Birth: 04/20/1946           MRN: [id number] Visit Date: 01/27/2018              Requested by: Farris HasMorrow, Aaron, MD [address] [place], [place] [address] PCP: Farris HasMorrow, Aaron, MD   Assessment & Plan: Visit Diagnoses:  1. Acute pain of left shoulder   2. Other closed nondisplaced fracture of proximal end of left humerus, initial encounter     Plan: Patient was put in a shoulder sling today.  He did not wear his to the clinic for this visit.  Advised that it is important that he is compliant with wearing his sling to give his fracture a better chance of healing and to prevent further displacement.  He was given Ultram for pain.  Advised that he should not consume alcohol while taking his medication.  Follow-up in 2 weeks for recheck with repeat x-ray.  Follow-Up Instructions: Return in about 2 weeks (around 02/10/2018) for WITH DR NITKA TO RECHECK SHOULDER.   Orders:  Orders Placed This Encounter  Procedures  . XR Shoulder 1V Left   Meds ordered this encounter  Medications  . traMADol (ULTRAM) 50 MG tablet    Sig: Take 1 tablet (50 mg total) by mouth every 8 (eight) hours as needed.    Dispense:  40 tablet    Refill:  0      Procedures: No procedures performed   Clinical Data: No additional findings.   Subjective: Chief Complaint  Patient presents with  . Left Shoulder - Pain    HPI 72 year old black male comes in today with complaints of right shoulder pain.  Patient suffered a fall January 21, 2018 where he tripped landing directly onto his left side and [facility].  Had x-rays of the left shoulders and report showed:  IMPRESSION: Acute comminuted and mildly displaced proximal humerus fracture  He was put in a sling but admits to not wearing this at all times.  States that he was told that he can come out of it so it is not choking when he is sleeping.  Patient also  complaining of left wrist pain.  Had x-rays in the emergency room and these were negative for fracture.  No current cardiac pulmonary GI GU issues  Objective: Vital Signs: BP (!) 151/80   Pulse (!) 117   Ht 5\' 8"  (1.727 m)   Wt 268 lb (121.6 kg)   BMI 40.75 kg/m   Physical Exam  Constitutional: He is oriented to person, place, and time. No distress.  HENT:  Head: Normocephalic and atraumatic.  Eyes: Pupils are equal, round, and reactive to light. EOM are normal.  Pulmonary/Chest: No respiratory distress.  Musculoskeletal: He exhibits tenderness (Left shoulder, left wrist).  Neurological: He is alert and oriented to person, place, and time.    Ortho Exam  Specialty Comments:  No specialty comments available.  Imaging: No results found.   PMFS History: Patient Active Problem List   Diagnosis Date Noted  . Thrombocytopenia (HCC) 10/28/2016  . Lactic acidosis   . Alcohol use   . Metabolic acidosis, increased anion gap (IAG)   . Psoriasis   . Pressure injury of skin 10/24/2016  . Pancytopenia (HCC)   . Thrush   . Cellulitis 10/23/2016  . Hyperglycemia   . Superinfection   .  AKI (acute kidney injury) (HCC)   . DISC DISEASE, LUMBAR 06/26/2010  . SLEEP APNEA, OBSTRUCTIVE 12/06/2009  . INSOMNIA-SLEEP DISORDER-UNSPEC 11/19/2009  . HYPERSOMNIA 11/19/2009  . PERIPHERAL EDEMA 11/19/2009  . KNEE PAIN, BILATERAL 05/18/2008  . LEG PAIN, BILATERAL 05/18/2008  . DIABETES MELLITUS, TYPE II 06/06/2007  . HYPERLIPIDEMIA 06/06/2007  . HYPERTENSION 06/06/2007  . OSTEOARTHROSIS UNSPEC WHETHER GEN/LOC ANK&FOOT 06/06/2007   Past Medical History:  Diagnosis Date  . Diabetes mellitus without complication (HCC)   . Hypertension   . Psoriasis     History reviewed. No pertinent family history.  History reviewed. No pertinent surgical history. Social History   Occupational History  . Not on file  Tobacco Use  . Smoking status: Current Every Day Smoker    Years: 30.00     Types: Cigars  . Smokeless tobacco: Never Used  Substance and Sexual Activity  . Alcohol use: Yes  . Drug use: No  . Sexual activity: Not on file

## 2018-01-27 NOTE — Patient Instructions (Signed)
Must be compliant with wearing shoulder immobilizer per my instructions today in the clinic.  Recommend that this be on at ALL times except for when showering.  When showering you must keep your arm down and close to your body as instructed.   Do not consume alcohol or drive when using the pain medication that was prescribed today.   Must keep follow-up appointment with Dr. Otelia SergeantNitka in 2 weeks for recheck and repeat x-rays of left shoulder.   Can use ice off and on to left shoulder and left wrist as needed for pain.

## 2018-02-02 ENCOUNTER — Telehealth (INDEPENDENT_AMBULATORY_CARE_PROVIDER_SITE_OTHER): Payer: Self-pay | Admitting: Surgery

## 2018-02-02 NOTE — Telephone Encounter (Signed)
Pharmacy called wondering if Fayrene FearingJames would still like tramadol filled for patient since he was just prescribed percocet from another provider. Nitka patient -  Please advise pharmacy# [phone]---Please advise

## 2018-02-02 NOTE — Telephone Encounter (Signed)
Pharmacy called wondering if Fayrene FearingJames would still like tramadol filled for patient since he was just prescribed percocet from another provider. Nitka patient -  Please advise pharmacy# [phone]

## 2018-02-07 ENCOUNTER — Telehealth (INDEPENDENT_AMBULATORY_CARE_PROVIDER_SITE_OTHER): Payer: Self-pay | Admitting: Specialist

## 2018-02-07 NOTE — Telephone Encounter (Signed)
I called and lmom for pt ---please see other message

## 2018-02-07 NOTE — Telephone Encounter (Signed)
Pt called left Vm med not received

## 2018-02-07 NOTE — Telephone Encounter (Signed)
I called and lmom for pt to call us back---per Dr. Otelia SergeantNitka and the  control website, looks like he is in pain Management with Dr. Vear ClockPhillips, he needs to get Narcotics from them or they will discharge him from their office if we give him meds.

## 2018-02-07 NOTE — Telephone Encounter (Signed)
Patient called asked for a call back concerning his Rx for (Tramadol) The number to contact patient is [phone]

## 2018-02-08 NOTE — Telephone Encounter (Signed)
I agree to not filling the prescription.  I didn't realize he was in pain management.  He never said anything. Thanks.

## 2018-02-08 NOTE — Telephone Encounter (Signed)
Per Chenise B.  She spoke with pt and asked if he was still in Pain Management and he states that he is, she advised that if we give him meds then they will Discharge them from there office, he states that he will call them.

## 2018-02-10 ENCOUNTER — Ambulatory Visit (INDEPENDENT_AMBULATORY_CARE_PROVIDER_SITE_OTHER): Payer: Medicare Other | Admitting: Specialist

## 2018-02-10 ENCOUNTER — Telehealth (INDEPENDENT_AMBULATORY_CARE_PROVIDER_SITE_OTHER): Payer: Self-pay | Admitting: Specialist

## 2018-02-10 ENCOUNTER — Encounter (INDEPENDENT_AMBULATORY_CARE_PROVIDER_SITE_OTHER): Payer: Self-pay | Admitting: Specialist

## 2018-02-10 ENCOUNTER — Ambulatory Visit (INDEPENDENT_AMBULATORY_CARE_PROVIDER_SITE_OTHER): Payer: Self-pay

## 2018-02-10 ENCOUNTER — Encounter (INDEPENDENT_AMBULATORY_CARE_PROVIDER_SITE_OTHER): Payer: Self-pay

## 2018-02-10 VITALS — BP 143/74 | HR 107 | Ht 68.0 in | Wt 268.0 lb

## 2018-02-10 DIAGNOSIS — S42224D 2-part nondisplaced fracture of surgical neck of right humerus, subsequent encounter for fracture with routine healing: Secondary | ICD-10-CM | POA: Diagnosis not present

## 2018-02-10 DIAGNOSIS — M25512 Pain in left shoulder: Secondary | ICD-10-CM | POA: Diagnosis not present

## 2018-02-10 NOTE — Telephone Encounter (Signed)
I put pt on cancellation list 

## 2018-02-10 NOTE — Progress Notes (Signed)
Office Visit Note   Patient: Casey Gibson           Date of Birth: March 22, 1946           MRN: [id number] Visit Date: 02/10/2018              Requested by: [name], MD [address] [place], [place] [address] PCP: [name], MD   Assessment & Plan: Visit Diagnoses:  1. Acute pain of left shoulder     Plan:Avoid overhead lifting and overhead use of the arms. Pillows to keep from sleeping directly on the shoulders Limited lifting to less than couple oz. Ice or heat for relief. Go to Physical therapy for left shoulder pendulum ROM exercises.   Follow-Up Instructions: No follow-ups on file.   Orders:  Orders Placed This Encounter  Procedures  . XR Shoulder Left   No orders of the defined types were placed in this encounter.     Procedures: No procedures performed   Clinical Data: No additional findings.   Subjective: Chief Complaint  Patient presents with  . Right Shoulder - Follow-up, Fracture    72 year old male with history of HTN and diabetes, he fell going to his brother'shome 3 weeks ago and sustained a left humerall neck fracture. Now 3 weeks post injury with good position and Alignment. No numbness, has some swelling in the left arm and left hand.    Review of Systems  Constitutional: Negative.   HENT: Negative.   Eyes: Negative.   Respiratory: Negative.   Cardiovascular: Negative.   Gastrointestinal: Negative.   Endocrine: Negative.   Genitourinary: Negative.   Musculoskeletal: Negative.   Skin: Negative.   Allergic/Immunologic: Negative.   Neurological: Negative.   Hematological: Negative.   Psychiatric/Behavioral: Negative.      Objective: Vital Signs: BP (!) 143/74 (BP Location: Left Arm, Patient Position: Sitting)   Pulse (!) 107   Ht 5\' 8"  (1.727 m)   Wt 268 lb (121.6 kg)   BMI 40.75 kg/m   Physical Exam  Constitutional: He is oriented to person, place, and time. He appears well-developed and  well-nourished.  HENT:  Head: Normocephalic and atraumatic.  Eyes: Pupils are equal, round, and reactive to light. EOM are normal.  Neck: Normal range of motion. Neck supple.  Pulmonary/Chest: Effort normal and breath sounds normal.  Abdominal: Soft. Bowel sounds are normal.  Neurological: He is alert and oriented to person, place, and time.  Skin: Skin is warm and dry.  Psychiatric: He has a normal mood and affect. His behavior is normal. Judgment and thought content normal.    Right Shoulder Exam  Right shoulder exam is normal.   Left Shoulder Exam   Tenderness  The patient is experiencing tenderness in the acromion and biceps tendon.  Range of Motion  Active abduction: abnormal  Passive abduction: abnormal  Extension: abnormal  External rotation: abnormal  Forward flexion: abnormal   Muscle Strength  Biceps: 5/5   Tests  Sulcus: absent  Other  Erythema: absent Scars: absent Sensation: normal Pulse: present   Comments:  Left shoulder at the side, no pain with removal of the swathe portion of the shoulder immobilizer. ROM not tested due to fracture but overall less pain today with removal of the swathe and light movement of the left shoulder.       Specialty Comments:  No specialty comments available.  Imaging: No results found.   PMFS History: Patient Active Problem List  Diagnosis Date Noted  . Thrombocytopenia (HCC) 10/28/2016  . Lactic acidosis   . Alcohol use   . Metabolic acidosis, increased anion gap (IAG)   . Psoriasis   . Pressure injury of skin 10/24/2016  . Pancytopenia (HCC)   . Thrush   . Cellulitis 10/23/2016  . Hyperglycemia   . Superinfection   . AKI (acute kidney injury) (HCC)   . DISC DISEASE, LUMBAR 06/26/2010  . SLEEP APNEA, OBSTRUCTIVE 12/06/2009  . INSOMNIA-SLEEP DISORDER-UNSPEC 11/19/2009  . HYPERSOMNIA 11/19/2009  . PERIPHERAL EDEMA 11/19/2009  . KNEE PAIN, BILATERAL 05/18/2008  . LEG PAIN, BILATERAL 05/18/2008  .  DIABETES MELLITUS, TYPE II 06/06/2007  . HYPERLIPIDEMIA 06/06/2007  . HYPERTENSION 06/06/2007  . OSTEOARTHROSIS UNSPEC WHETHER GEN/LOC ANK&FOOT 06/06/2007   Past Medical History:  Diagnosis Date  . Diabetes mellitus without complication (HCC)   . Hypertension   . Psoriasis     No family history on file.  No past surgical history on file. Social History   Occupational History  . Not on file  Tobacco Use  . Smoking status: Current Every Day Smoker    Years: 30.00    Types: Cigars  . Smokeless tobacco: Never Used  Substance and Sexual Activity  . Alcohol use: Yes  . Drug use: No  . Sexual activity: Not on file

## 2018-02-10 NOTE — Telephone Encounter (Signed)
2 week f/u needed  Sched pt appt  03/31/18 9:45am

## 2018-02-10 NOTE — Patient Instructions (Addendum)
Avoid overhead lifting and overhead use of the arms. Pillows to keep from sleeping directly on the shoulders Limited lifting to less than couple oz. Ice or heat for relief. Go to Physical therapy for left shoulder pendulum ROM exercises.  Return in 2 weeks for repeat xrays.

## 2018-02-28 ENCOUNTER — Telehealth (INDEPENDENT_AMBULATORY_CARE_PROVIDER_SITE_OTHER): Payer: Self-pay | Admitting: Specialist

## 2018-02-28 NOTE — Telephone Encounter (Signed)
Patient called advised he was suppose to come back to see Dr Otelia SergeantNitka in 10 days and have not been rescheduled yet. Patient said he is hurting. The number to contact patient is [phone]

## 2018-03-03 NOTE — Telephone Encounter (Signed)
I called and advised patient that Dr. Otelia SergeantNitka does not have anything available but he is on the cancellation list and I will call once something opens up

## 2018-03-08 ENCOUNTER — Ambulatory Visit (INDEPENDENT_AMBULATORY_CARE_PROVIDER_SITE_OTHER): Payer: Self-pay

## 2018-03-08 ENCOUNTER — Ambulatory Visit (INDEPENDENT_AMBULATORY_CARE_PROVIDER_SITE_OTHER): Payer: Medicare Other | Admitting: Specialist

## 2018-03-08 ENCOUNTER — Encounter (INDEPENDENT_AMBULATORY_CARE_PROVIDER_SITE_OTHER): Payer: Self-pay | Admitting: Specialist

## 2018-03-08 VITALS — BP 148/79 | HR 114 | Ht 68.0 in | Wt 268.0 lb

## 2018-03-08 DIAGNOSIS — M79642 Pain in left hand: Secondary | ICD-10-CM

## 2018-03-08 DIAGNOSIS — S42224D 2-part nondisplaced fracture of surgical neck of right humerus, subsequent encounter for fracture with routine healing: Secondary | ICD-10-CM | POA: Diagnosis not present

## 2018-03-08 DIAGNOSIS — Z720 Tobacco use: Secondary | ICD-10-CM

## 2018-03-08 DIAGNOSIS — M7989 Other specified soft tissue disorders: Secondary | ICD-10-CM

## 2018-03-08 DIAGNOSIS — M19032 Primary osteoarthritis, left wrist: Secondary | ICD-10-CM

## 2018-03-08 NOTE — Progress Notes (Signed)
Office Visit Note   Patient: Casey Gibson           Date of Birth: 12/14/1945           MRN: [id number] Visit Date: 03/08/2018              Requested by: Farris HasMorrow, Aaron, MD [address] [place], [place] [address] PCP: Farris HasMorrow, Aaron, MD   Assessment & Plan: Visit Diagnoses:  1. Closed 2-part nondisplaced fracture of surgical neck of right humerus with routine healing, subsequent encounter   2. Pain in left hand   3. Swelling of left hand   4. Nicotine abuse   5. Primary osteoarthritis of left wrist   6. Arthritis of left wrist     Plan: Patient continue using shoulder immobilizer.  Dr. Otelia SergeantNitka did review x-rays and does believe that he has had some healing up to this point.  Stressed patient that he should not be pushing pulling lifting with his arm.  We will send patient to formal PT per Dr. Otelia SergeantNitka to do pendulum exercises only.  They can also do some rehab for the left wrist in hopes of decreasing pain and swelling.  Dr. Otelia SergeantNitka did not think patient had any signs of RSD in the left hand.  Advised patient that he needs to get reestablished with a rheumatologist for history of RA that he mentioned.  Dr. Otelia SergeantNitka wants to get a left wrist CT scan and patient will follow-up after completion to discuss results.  Follow-Up Instructions: Return in about 2 weeks (around 03/22/2018) for with Dr Otelia SergeantNitka for recheck.   Orders:  Orders Placed This Encounter  Procedures  . XR Shoulder Left  . XR Hand Complete Left   No orders of the defined types were placed in this encounter.     Procedures: No procedures performed   Clinical Data: No additional findings.   Subjective: Chief Complaint  Patient presents with  . Left Shoulder - Follow-up  . Left Hand - Edema, Pain    HPI 72 year old white male who is 72 about 6 weeks out from left proximal humerus fracture returns.  States that he continues have ongoing pain in his left shoulder.  He is also complaining of increased  left hand pain and swelling.  No hand injury.  After speaking with patient he has not been overly compliant with shoulder limitations/restrictions.  Patient also has history of nicotine abuse.  And asked me what does smoking have to do with his shoulder healing. Review of Systems No current cardiac pulmonary GI GU issues  Objective: Vital Signs: BP (!) 148/79 (BP Location: Right Arm, Patient Position: Sitting)   Pulse (!) 114   Ht 5\' 8"  (1.727 m)   Wt 268 lb (121.6 kg)   BMI 40.75 kg/m   Physical Exam  Constitutional: He is oriented to person, place, and time. No distress.  Patient very argumentative during my visit with him today.  HENT:  Head: Normocephalic.  Eyes: Pupils are equal, round, and reactive to light.  Musculoskeletal:  Left shoulder obviously tender.  Does have some swelling around the shoulder.  Left mild to moderate hand swelling.  Question hypersensitivity of the left hand with light palpation.  No signs of infection.  Left forearm is nontender.  Neurological: He is alert and oriented to person, place, and time.    Ortho Exam  Specialty Comments:  No specialty comments available.  Imaging: Xr Hand Complete Left  Result Date: 03/08/2018 X-ray left hand  shows degenerative changes throughout the MCP joints.  He also has moderate to severe degenerative changes throughout the wrist.  Xr Shoulder Left  Result Date: 03/08/2018 Left shoulder does not show much of healing  his fracture.  No further displacement.    PMFS History: Patient Active Problem List   Diagnosis Date Noted  . Thrombocytopenia (HCC) 10/28/2016  . Lactic acidosis   . Alcohol use   . Metabolic acidosis, increased anion gap (IAG)   . Psoriasis   . Pressure injury of skin 10/24/2016  . Pancytopenia (HCC)   . Thrush   . Cellulitis 10/23/2016  . Hyperglycemia   . Superinfection   . AKI (acute kidney injury) (HCC)   . DISC DISEASE, LUMBAR 06/26/2010  . SLEEP APNEA, OBSTRUCTIVE 12/06/2009    . INSOMNIA-SLEEP DISORDER-UNSPEC 11/19/2009  . HYPERSOMNIA 11/19/2009  . PERIPHERAL EDEMA 11/19/2009  . KNEE PAIN, BILATERAL 05/18/2008  . LEG PAIN, BILATERAL 05/18/2008  . DIABETES MELLITUS, TYPE II 06/06/2007  . HYPERLIPIDEMIA 06/06/2007  . HYPERTENSION 06/06/2007  . OSTEOARTHROSIS UNSPEC WHETHER GEN/LOC ANK&FOOT 06/06/2007   Past Medical History:  Diagnosis Date  . Diabetes mellitus without complication (HCC)   . Hypertension   . Psoriasis     History reviewed. No pertinent family history.  History reviewed. No pertinent surgical history. Social History   Occupational History  . Not on file  Tobacco Use  . Smoking status: Current Every Day Smoker    Years: 30.00    Types: Cigars  . Smokeless tobacco: Never Used  Substance and Sexual Activity  . Alcohol use: Yes  . Drug use: No  . Sexual activity: Not on file

## 2018-03-16 ENCOUNTER — Encounter: Payer: Self-pay | Admitting: Physical Therapy

## 2018-03-16 ENCOUNTER — Ambulatory Visit: Payer: Medicare Other | Attending: Specialist | Admitting: Physical Therapy

## 2018-03-16 DIAGNOSIS — M25512 Pain in left shoulder: Secondary | ICD-10-CM | POA: Insufficient documentation

## 2018-03-16 DIAGNOSIS — M79642 Pain in left hand: Secondary | ICD-10-CM | POA: Insufficient documentation

## 2018-03-16 DIAGNOSIS — R600 Localized edema: Secondary | ICD-10-CM

## 2018-03-16 NOTE — Patient Instructions (Signed)
Pendulum all planes Elbow passive extension

## 2018-03-16 NOTE — Therapy (Addendum)
[place], [place], [address] Phone: [phone]   Fax:  [phone]  Physical Therapy Evaluation/Discharge  Patient Details  Name: Casey Gibson MRN: [id number] Date of Birth: 11-23-1945 Referring Provider: Dr. Louanne Skye    Encounter Date: 03/16/2018  PT End of Session - 03/16/18 1325    Visit Number  1    Number of Visits  16    Date for PT Re-Evaluation  05/11/18    PT Start Time  0800    PT Stop Time  0845    PT Time Calculation (min)  45 min    Activity Tolerance  Patient tolerated treatment well    Behavior During Therapy  [facility] for tasks assessed/performed       Past Medical History:  Diagnosis Date  . Diabetes mellitus without complication ([facility])   . Hypertension   . Psoriasis     History reviewed. No pertinent surgical history.  There were no vitals filed for this visit.   Subjective Assessment - 03/16/18 0805    Subjective  Patient fell about 6 weeks ago and fractured L humeral neck.  He cont to have pain and significant swelling in hand and wrist.  He wears the sling all the time as directed.  Has difficulty driving, getting dressed, general mobility, and sleeping due to limitations in use of LUE .   The majority of his pain is in his hand.  Shoulder pain is min and mostly only when he gets cold.      Pertinent History  diabetes, HTN, psoriasis, back pain     Limitations  Lifting;Standing;Walking;House hold activities;Other (comment)    Diagnostic tests  MRI tomorrow.  XR showed left comminuted humeral neck with minimal healing last week.  L hand with degenerative changes     Patient Stated Goals  Restore function and pain.     Currently in Pain?  Yes    Pain Score  8     Pain Location  Hand    Pain Orientation  Left    Pain Descriptors / Indicators  Sharp    Pain Type  Acute pain    Pain Onset  More than a month ago    Pain Frequency  Intermittent    Aggravating Factors   getting up,  taking off sling, dependent position     Pain Relieving Factors  keeping it still , pain meds     Effect of Pain on Daily Activities  unable to use LE     Multiple Pain Sites  Yes    Pain Score  0    Pain Location  Shoulder    Pain Orientation  Left;Lateral    Pain Descriptors / Indicators  Aching    Pain Type  Acute pain    Pain Onset  More than a month ago    Pain Frequency  Intermittent    Aggravating Factors   cold, movement    Pain Relieving Factors  sling, rest     Effect of Pain on Daily Activities  unable to use L UE          [facility] PT Assessment - 03/16/18 0001      Assessment   Medical Diagnosis  L prox humeral fracture     Referring Provider  Dr. Louanne Skye     Onset Date/Surgical Date  01/21/18    Hand Dominance  Right    Next MD Visit  after MRI 04/13/18    Prior Therapy  No      Precautions   Precautions  None    Required Braces or Orthoses  Sling      Restrictions   Weight Bearing Restrictions  No      Balance Screen   Has the patient fallen in the past 6 months  Yes    How many times?  1, caught foot on the threshold of the door     Has the patient had a decrease in activity level because of a fear of falling?   Yes    Is the patient reluctant to leave their home because of a fear of falling?   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    Leisure  TV, sedentary       Cognition   Overall Cognitive Status  Within Functional Limits for tasks assessed      Observation/Other Assessments   Focus on Therapeutic Outcomes (FOTO)   88%      Observation/Other  Assessments-Edema    Edema  Circumferential      Circumferential Edema   Circumferential - Right  8.25 inch    around palm of hand    Circumferential - Left   9 inch       Sensation   Additional Comments  fingers are numb , thumb is better than fingers       Posture/Postural Control   Posture/Postural Control  Postural limitations    Posture Comments  elbow flexed, L shoulder elevated       PROM   Overall PROM Comments  pain with wrist ext, radial and .ulnar deviation , none with  finger  ext     Left Shoulder Flexion  75 Degrees    Left Shoulder ABduction  75 Degrees    Left Shoulder Internal Rotation  --   NT due to arm in internal rotation   Left Shoulder External Rotation  -20 Degrees    Left Elbow Flexion  126    Left Elbow Extension  -50   pain      Strength   Overall Strength Comments  NT due to pain, unable to make fist the L hand       Palpation   Palpation comment  pain in L lateral aspect of shoulder, min in forearm , edema in hand, tender throughout                 Objective measurements completed on examination: See above findings.      [facility] PT Treatment/Exercise - 03/16/18 0001      Shoulder Exercises: ROM/Strengthening   Pendulum  all planes, cues for body mechanics to protect back not use arm , technique      Modalities   Modalities  Moist Heat      Moist Heat Therapy   Number Minutes Moist Heat  10 Minutes    Moist Heat Location  Shoulder  Manual Therapy   Kinesiotex  Edema      Kinesiotix   Edema  2 fans on L hand to relieve swelling              PT Education - 03/16/18 1340    Education Details  PT/POC, eval finding, PROM only, sling exercises, swelling, tape     Person(s) Educated  Patient    Methods  Explanation    Comprehension  Verbalized understanding;Returned demonstration;Verbal cues required;Need further instruction       PT Short Term Goals - 03/16/18 1736      PT SHORT TERM GOAL #1   Title  Pt  will be I with pendulums, initial HEP as allowed by MD     Time  3    Period  Weeks    Status  New    Target Date  04/06/18      PT SHORT TERM GOAL #2   Title  Pt will tolerate PROM to 90 deg in flexion and abduction     Time  4    Period  Weeks    Status  New    Target Date  04/13/18      PT SHORT TERM GOAL #3   Title  Pt will be able to make a fist with L hand, showing improved swelling.     Time  4    Period  Weeks    Status  New    Target Date  04/13/18      PT SHORT TERM GOAL #4   Title  Pt will be able to show L elbow extension to no more than 20 deg limited for improved function    Time  4    Period  Weeks    Status  New    Target Date  04/13/18        PT Long Term Goals - 03/16/18 1342      PT LONG TERM GOAL #1   Title  Pt will improve FOTO score to no more than 65% limited to show improved functional mobility.     Time  8    Period  Weeks    Status  New    Target Date  05/11/18      PT LONG TERM GOAL #2   Title  Pt will have no pain in L hand at rest , regain ability to hold items and use L UE for ADLs.     Time  8    Period  Weeks    Status  New    Target Date  05/11/18      PT LONG TERM GOAL #3   Title  Pt will be I with all exercises for L UE as of last visit.     Time  8    Period  Weeks    Status  New    Target Date  05/11/18      PT LONG TERM GOAL #4   Title  Pt wil be able to actively lift L UE to 90 deg for improved mobility and ADLs     Time  8    Period  Weeks    Status  New    Target Date  05/11/18      PT LONG TERM GOAL #5   Title  More goals to be set as patient progresses             Plan - 03/16/18 1746    Clinical Impression Statement  Patient presents with moderate complexity eval of LUE following a fall about 8 weeks ago.  Follow up XR showed poor healing of L humeral neck fracture. He has significant pain in L hand accompanied by edema.  He is not allowed active movement of L UE at this point.  His shoulder is stiff  in all planes namely external rotation and elbow is held in about 50 deg flexion. He is having an MRI tomorrow.  He may be able to remove sling to allow elbow stretching, will follow up with MD regarding this.     History and Personal Factors relevant to plan of care:  lives alone, HTN, fall risk, back pain, diabetes    Clinical Presentation  Evolving    Clinical Presentation due to:  poor healing     Clinical Decision Making  Moderate    Rehab Potential  Good    PT Frequency  2x / week    PT Duration  8 weeks    PT Treatment/Interventions  ADLs/Self Care Home Management;Electrical Stimulation;Functional mobility training;Neuromuscular re-education;Taping;Passive range of motion;Manual lymph drainage;Manual techniques;Patient/family education;Cryotherapy;Ultrasound;Moist Heat;Iontophoresis 79m/ml Dexamethasone;Therapeutic activities;Therapeutic exercise    PT Next Visit Plan  MRI? pendulum, PROM, elbow hand shoulder , heat to shoulder , how was tape     PT Home Exercise Plan  pendulum     Consulted and Agree with Plan of Care  Patient       Patient will benefit from skilled therapeutic intervention in order to improve the following deficits and impairments:  Decreased mobility, Hypomobility, Impaired sensation, Increased edema, Decreased range of motion, Decreased activity tolerance, Decreased strength, Decreased knowledge of use of DME, Increased fascial restricitons, Impaired flexibility, Impaired UE functional use, Postural dysfunction, Pain, Obesity  Visit Diagnosis: Acute pain of left shoulder  Localized edema  Pain in left hand     Problem List Patient Active Problem List   Diagnosis Date Noted  . Thrombocytopenia ([place] 10/28/2016  . Lactic acidosis   . Alcohol use   . Metabolic acidosis, increased anion gap (IAG)   . Psoriasis   . Pressure injury of skin 10/24/2016  . [place] ([place]   . Thrush   . Cellulitis 10/23/2016  . Hyperglycemia   . Superinfection   . AKI  (acute kidney injury) ([place]   . D[place] CanalDISEASE, LUMBAR 06/26/2010  . SLEEP APNEA, OBSTRUCTIVE 12/06/2009  . INSOMNIA-SLEEP DISORDER-UNSPEC 11/19/2009  . HYPERSOMNIA 11/19/2009  . PERIPHERAL EDEMA 11/19/2009  . KNEE PAIN, BILATERAL 05/18/2008  . LEG PAIN, BILATERAL 05/18/2008  . DIABETES MELLITUS, TYPE II 06/06/2007  . HYPERLIPIDEMIA 06/06/2007  . HYPERTENSION 06/06/2007  . OSTEOARTHROSIS UNSPEC WHETHER GEN/LOC ANK&FOOT 06/06/2007    Justan Gaede 03/17/2018, 6:04 AM  [facility] [address]Phone: [phone]  Fax:  [phone] Name: LBRACE WELTEMRN: [id number] of Birth: [birth year]  JRaeford Razor PT 03/17/18 6:04 AM Phone: [phone]: [phone]  PHYSICAL THERAPY DISCHARGE SUMMARY  Visits from Start of Care: 1  Current functional level related to goals / functional outcomes: As above    Remaining deficits: As above    Education / Equipment: None other that initial  Plan: Patient agrees to discharge.  Patient goals were not met. Patient is being discharged due to not returning since the last visit.  ?????    JRaeford Razor PT 04/18/18 11:40 AM Phone: [phone]: [phone]

## 2018-03-17 ENCOUNTER — Ambulatory Visit
Admission: RE | Admit: 2018-03-17 | Discharge: 2018-03-17 | Disposition: A | Discharge status: Home / Self Care | Payer: Medicare Other | Source: Ambulatory Visit | Attending: Surgery | Admitting: Surgery

## 2018-03-17 DIAGNOSIS — M19032 Primary osteoarthritis, left wrist: Secondary | ICD-10-CM

## 2018-03-31 ENCOUNTER — Ambulatory Visit (INDEPENDENT_AMBULATORY_CARE_PROVIDER_SITE_OTHER): Payer: Medicare Other | Admitting: Specialist

## 2018-04-13 ENCOUNTER — Ambulatory Visit (INDEPENDENT_AMBULATORY_CARE_PROVIDER_SITE_OTHER): Payer: Medicare Other | Admitting: Specialist

## 2018-04-13 ENCOUNTER — Ambulatory Visit (INDEPENDENT_AMBULATORY_CARE_PROVIDER_SITE_OTHER): Payer: Medicare Other

## 2018-04-13 ENCOUNTER — Encounter (INDEPENDENT_AMBULATORY_CARE_PROVIDER_SITE_OTHER): Payer: Self-pay | Admitting: Specialist

## 2018-04-13 VITALS — BP 144/81 | HR 117 | Ht 68.0 in | Wt 268.0 lb

## 2018-04-13 DIAGNOSIS — S42224D 2-part nondisplaced fracture of surgical neck of right humerus, subsequent encounter for fracture with routine healing: Secondary | ICD-10-CM | POA: Diagnosis not present

## 2018-04-13 DIAGNOSIS — M154 Erosive (osteo)arthritis: Secondary | ICD-10-CM | POA: Diagnosis not present

## 2018-04-13 DIAGNOSIS — M1A0320 Idiopathic chronic gout, left wrist, without tophus (tophi): Secondary | ICD-10-CM

## 2018-04-13 MED ORDER — ALLOPURINOL 100 MG PO TABS: 100.0000 mg | ORAL_TABLET | Freq: Two times a day (BID) | ORAL | 6 refills | Status: AC

## 2018-04-13 MED ORDER — DICLOFENAC SODIUM 1 % TD GEL: 4.0000 g | Freq: Four times a day (QID) | TRANSDERMAL | 3 refills | Status: AC

## 2018-04-13 NOTE — Patient Instructions (Addendum)
Plan:Avoid overhead lifting and overhead use of the arms. Do not lift greater than 10 lbs. Tylenol ES one every 6-8 hours for pain and inflamation.  Continue with home exercise program. Discontinue the sling left arm Start ROM of the left shoulder and left elbow and left hand. Use a splint for the left wrist.  WARM  Soaks with shower in a basin.

## 2018-04-13 NOTE — Progress Notes (Signed)
Office Visit Note   Patient: Casey Gibson           Date of Birth: [birth year]           MRN: [id number] Visit Date: 04/13/2018              Requested by: [name], MD [address] [place], [place] [address] PCP: [name], MD   Assessment & Plan: Visit Diagnoses:  1. Closed 2-part nondisplaced fracture of surgical neck of right humerus with routine healing, subsequent encounter     Plan:Avoid overhead lifting and overhead use of the arms. Do not lift greater than 10 lbs. Tylenol ES one every 6-8 hours for pain and inflamation. Call if you are having right knee pain and need to consider a cortisone injection into the right knee. Continue with PT for another 2-3 weeks, then a home exercise program. Discontinue the sling left arm Start ROM of the left shoulder and left elbow and left hand. Use a splint for the left wrist.  WARM  Soaks with shower in a basin.  Follow-Up Instructions: No follow-ups on file.   Orders:  Orders Placed This Encounter  Procedures  . XR Shoulder Left   No orders of the defined types were placed in this encounter.     Procedures: No procedures performed   Clinical Data: No additional findings.   Subjective: Chief Complaint  Patient presents with  . 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 He fell 7/5 sustaining a left proximal humeral neck fracture, has been treated concervatively. Complains of left hand stiffness and wrist pain. Unable to make a fist with the left hand due to stiffness in the MCP joints. Underwent a recent left wrist CT scan which shows severe erosive osteo arthritis of the left wrist with DISI pattern of wrist collapse. There in mild DJD of the MCP joints but not acute bone injury.    Review of Systems  Constitutional: Negative.   HENT: Negative.   Eyes:  Negative.   Respiratory: Negative.   Cardiovascular: Negative.   Gastrointestinal: Negative.   Endocrine: Negative.   Genitourinary: Negative.   Musculoskeletal: Negative.   Skin: Negative.   Allergic/Immunologic: Negative.   Neurological: Negative.   Hematological: Negative.   Psychiatric/Behavioral: Negative.      Objective: Vital Signs: BP (!) 144/81 (BP Location: Right Arm, Patient Position: Sitting)   Pulse (!) 117   Ht 5\' 8"  (1.727 m)   Wt 268 lb (121.6 kg)   BMI 40.75 kg/m   Physical Exam  Constitutional: He is oriented to person, place, and time. He appears well-developed and well-nourished.  HENT:  Head: Normocephalic and atraumatic.  Eyes: Pupils are equal, round, and reactive to light. EOM are normal.  Neck: Normal range of motion. Neck supple.  Pulmonary/Chest: Effort normal and breath sounds normal.  Abdominal: Soft. Bowel sounds are normal.  Musculoskeletal: Normal range of motion.  Neurological: He is alert and oriented to person, place, and time.  Skin: Skin is warm and dry.  Psychiatric: He has a normal mood and affect. His behavior is normal. Judgment and thought content normal.    Ortho Exam  Specialty Comments:  No specialty comments available.  Imaging: No results found.   PMFS History: Patient Active Problem List   Diagnosis Date Noted  . Thrombocytopenia (HCC) 10/28/2016  .  Lactic acidosis   . Alcohol use   . Metabolic acidosis, increased anion gap (IAG)   . Psoriasis   . Pressure injury of skin 10/24/2016  . Pancytopenia (HCC)   . Thrush   . Cellulitis 10/23/2016  . Hyperglycemia   . Superinfection   . AKI (acute kidney injury) (HCC)   . DISC DISEASE, LUMBAR 06/26/2010  . SLEEP APNEA, OBSTRUCTIVE 12/06/2009  . INSOMNIA-SLEEP DISORDER-UNSPEC 11/19/2009  . HYPERSOMNIA 11/19/2009  . PERIPHERAL EDEMA 11/19/2009  . KNEE PAIN, BILATERAL 05/18/2008  . LEG PAIN, BILATERAL 05/18/2008  . DIABETES MELLITUS, TYPE II 06/06/2007  .  HYPERLIPIDEMIA 06/06/2007  . HYPERTENSION 06/06/2007  . OSTEOARTHROSIS UNSPEC WHETHER GEN/LOC ANK&FOOT 06/06/2007   Past Medical History:  Diagnosis Date  . Diabetes mellitus without complication (HCC)   . Hypertension   . Psoriasis     No family history on file.  No past surgical history on file. Social History   Occupational History  . Not on file  Tobacco Use  . Smoking status: Current Every Day Smoker    Years: 30.00    Types: Cigars  . Smokeless tobacco: Never Used  Substance and Sexual Activity  . Alcohol use: Yes  . Drug use: No  . Sexual activity: Not on file

## 2018-04-18 NOTE — Progress Notes (Signed)
Office Visit Note  Patient: Casey Gibson             Date of Birth: 1946/04/05           MRN: [id number]             PCP: [name], MD Referring: Kerrin Champagne, MD Visit Date: 04/27/2018 Occupation: Retired Naval architect  Subjective:  Pain in multiple joints.   History of Present Illness: Casey Gibson is a 72 y.o. male seen in consultation per request of Dr. Otelia Sergeant.  According to patient about 2 months ago he fell and fractured his humerus neck on the left side.  Since then he has been having swelling in his left hand.  He states he has persistent pain and swelling in the left hand without relief.  He has tried a sling and also hydrocodone which does not help.  He has had pain and discomfort in multiple joints over the years.  He describes pain in his bilateral elbows, bilateral hands, bilateral wrist, bilateral knees, bilateral ankles for at least 4 years.  He also had history of lower back pain and degenerative disc disease.  He underwent lumbar spine surgery in 2012.  According to patient at that time Dr. Otelia Sergeant found some gout crystals in his back region.  Due to his past history of having gout crystals he was placed on allopurinol last week by Dr. Otelia Sergeant.  He also gives history of psoriasis for last 30 years.  He was under care of Dr. Joseph Art and and Dr. Margo Aye.  He states he has been treated with topical agents.  He was given methotrexate one time but he had severe reaction and rash to the point that he was hospitalized and treated with antibiotics. Patient states that he was seen by Dr. Azzie Roup in the past for psoriatic arthritis and psoriasis.  He states he was started on Humira samples and was given for 1 year.  He states during that time his psoriasis and arthritis completely cleared up. Patient states that there is positive family history of multiple sclerosis and his mother.  There is no history of IBD in his family.  Activities of Daily Living:  Patient reports  morning stiffness for 2 minutes.   Patient Denies nocturnal pain.  Difficulty dressing/grooming: Denies Difficulty climbing stairs: Reports Difficulty getting out of chair: Reports Difficulty using hands for taps, buttons, cutlery, and/or writing: Denies  Review of Systems  Constitutional: Positive for fatigue. Negative for night sweats.  HENT: Negative for mouth sores, mouth dryness and nose dryness.   Eyes: Negative for redness and dryness.  Respiratory: Negative for shortness of breath and difficulty breathing.   Cardiovascular: Negative for chest pain, palpitations, hypertension, irregular heartbeat and swelling in legs/feet.  Gastrointestinal: Negative for constipation and diarrhea.  Endocrine: Negative for increased urination.  Musculoskeletal: Positive for arthralgias, joint pain, joint swelling and morning stiffness. Negative for myalgias, muscle weakness, muscle tenderness and myalgias.  Skin: Positive for rash. Negative for color change, hair loss, nodules/bumps, skin tightness, ulcers and sensitivity to sunlight.       psoriasis  Allergic/Immunologic: Negative for susceptible to infections.  Neurological: Negative for dizziness, fainting, memory loss, night sweats and weakness ( ).  Hematological: Negative for swollen glands.  Psychiatric/Behavioral: Positive for depressed mood and sleep disturbance. The patient is not nervous/anxious.     PMFS History:  Patient Active Problem List   Diagnosis Date Noted  . Thrombocytopenia (HCC) 10/28/2016  . Lactic acidosis   .  Alcohol use   . Metabolic acidosis, increased anion gap (IAG)   . Psoriasis   . Pressure injury of skin 10/24/2016  . Pancytopenia (HCC)   . Thrush   . Cellulitis 10/23/2016  . Hyperglycemia   . Superinfection   . AKI (acute kidney injury) (HCC)   . DISC DISEASE, LUMBAR 06/26/2010  . SLEEP APNEA, OBSTRUCTIVE 12/06/2009  . INSOMNIA-SLEEP DISORDER-UNSPEC 11/19/2009  . HYPERSOMNIA 11/19/2009  . PERIPHERAL  EDEMA 11/19/2009  . KNEE PAIN, BILATERAL 05/18/2008  . LEG PAIN, BILATERAL 05/18/2008  . DIABETES MELLITUS, TYPE II 06/06/2007  . HYPERLIPIDEMIA 06/06/2007  . HYPERTENSION 06/06/2007  . OSTEOARTHROSIS UNSPEC WHETHER GEN/LOC ANK&FOOT 06/06/2007    Past Medical History:  Diagnosis Date  . Diabetes mellitus without complication (HCC)   . Hypertension   . Psoriasis     Family History  Problem Relation Age of Onset  . Multiple sclerosis Mother   . Hypertension Mother   . Diabetes Mother   . Heart Problems Father   . Migraines Son    Past Surgical History:  Procedure Laterality Date  . BACK SURGERY     Social History   Social History Narrative  . Not on file    Objective: Vital Signs: BP 125/76 (BP Location: Right Arm, Patient Position: Sitting, Cuff Size: Normal)   Pulse (!) 119   Resp 17   Ht 5\' 7"  (1.702 m)   Wt 249 lb (112.9 kg)   BMI 39.00 kg/m    Physical Exam  Constitutional: He is oriented to person, place, and time. He appears well-developed and well-nourished.  HENT:  Head: Normocephalic and atraumatic.  Eyes: Pupils are equal, round, and reactive to light. Conjunctivae and EOM are normal.  Neck: Normal range of motion. Neck supple.  Cardiovascular: Normal rate, regular rhythm and normal heart sounds.  Pulmonary/Chest: Effort normal and breath sounds normal.  Abdominal: Soft. Bowel sounds are normal.  Neurological: He is alert and oriented to person, place, and time.  Skin: Skin is warm and dry. Capillary refill takes less than 2 seconds. Rash noted.  Psoriasis over trunk and extremities  Psychiatric: He has a normal mood and affect. His behavior is normal.  Nursing note and vitals reviewed.    Musculoskeletal Exam: C-spine good range of motion.  Thoracic and lumbar spine are difficult to assess due to limited mobility.  His left shoulder joint abduction was limited to 30 degrees.  He had edema on his left upper extremity.  Right shoulder joint was good  range of motion.  Right elbow joint was in good range of motion.  He is contracture in his left elbow joint.  He has limited range of motion of bilateral wrist joints.  He has severe swelling in his left hand.  He has tenderness over left wrist joints, MCP joints and PIP joints.  He had painful range of motion of bilateral knee joints with some warmth.  He had ankle joint swelling and warmth.  He has tenderness across MTPs and PIPs but no dactylitis was noted. CDAI Exam: CDAI Score: Not documented Patient Global Assessment: Not documented; Provider Global Assessment: Not documented Swollen: Not documented; Tender: Not documented Joint Exam   Not documented   There is currently no information documented on the homunculus. Go to the Rheumatology activity and complete the homunculus joint exam.  Investigation: No additional findings.  Imaging: Xr Foot 2 Views Left  Result Date: 04/27/2018 Juxta-articular osteopenia was noted.  First, second third and fourth MTPs, all PIP and  DIP narrowing was noted.  All PIP and DIP narrowing was noted.  Dorsal spurring was noted.  Calcaneal spur was noted.  Calcification of posterior tibial artery was noted.  Subtalar joint space narrowing was noted. Impression: These findings are consistent with inflammatory arthritis and osteoarthritis overlap most likely psoriatic arthritis.  Xr Foot 2 Views Right  Result Date: 04/27/2018 Juxta-articular osteopenia was noted.  First, second third and fourth MTPs, all PIP and DIP narrowing was noted.  All PIP and DIP narrowing was noted.  Dorsal spurring was noted.  Calcaneal spur was noted.  Calcification of posterior tibial artery was noted. Impression: These findings are consistent with inflammatory arthritis and osteoarthritis overlap most likely psoriatic arthritis.  Xr Hand 2 View Right  Result Date: 04/27/2018 First second and third MCP narrowing was noted.  PIP narrowing was noted.  DIP narrowing was noted.  A plate  was noted in the fourth metacarpal.  Callus was noted in the fifth metacarpal. Intercarpal and radiocarpal joint space narrowing was noted.  Erosive changes were noted in the carpal bones. 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   Xr Knee 3 View Left  Result Date: 04/27/2018 End-stage osteoarthritis of the left knee joint with severe medial compartment narrowing medial lateral osteophytes were noted.  Severe patellofemoral narrowing was noted. 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  Xr Knee 3 View Right  Result Date: 04/27/2018 Moderate to severe lateral compartment narrowing with lateral and intercondylar osteophytes was noted.  Severe patellofemoral narrowing was noted.  Calcification popliteal artery was noted. Impression: These findings are consistent with moderate to severe osteoarthritis of the knee joint and severe chondromalacia patella.  Xr Shoulder Left  Result Date: 04/13/2018 AP and lateral and outlet views left shoulder show humeral neck fracture in good position and alignment, callus is forming over the medial fracture site.    Recent Labs: Lab Results  Component Value Date   WBC 10.5 (H) 11/18/2016   HGB 12.6 (L) 11/18/2016   PLT 266 11/18/2016   NA 139 11/18/2016   K 4.6 11/18/2016   CL 101 10/28/2016   CO2 19 (L) 11/18/2016   GLUCOSE 229 (H) 11/18/2016   BUN 16.0 11/18/2016   CREATININE 1.2 11/18/2016   BILITOT 0.28 11/18/2016   ALKPHOS 82 11/18/2016   AST 40 (H) 11/18/2016   ALT 25 11/18/2016   PROT 7.9 11/18/2016   ALBUMIN 3.8 11/18/2016   CALCIUM 9.7 11/18/2016   GFRAA >60 10/28/2016    Speciality Comments: No specialty comments available.  Procedures:  No procedures performed Allergies: Simvastatin; Celecoxib; Sulfonamide derivatives; Methotrexate derivatives; and Penicillins   Assessment / Plan:     Visit Diagnoses:  Psoriatic arthritis (HCC) -patient complains of pain and swelling in his joints for multiple years.  He was treated with Humira samples for 1 year by Dr. Dareen Piano in the past.  He recalls resolution of his symptoms on Humira.  There is positive family history of multiple sclerosis in his mother.  I would like to avoid anti-TNF's.  He does have extensive psoriasis which was treated with methotrexate in the past and he had reaction to methotrexate.  We had detailed discussion regarding different treatment options.  I would prefer to start him on Cosentyx due to the family history of multiple sclerosis.  03/17/18 CT of left wrist: Advanced erosive arthropathy.  A handout on Cosentyx was given.  Consent was taken after side effects were  discussed at length.  Patient is ready to proceed with Cosentyx.  Once we have labs available we can start him on the medication.  Psoriasis-he has extensive psoriasis covering his trunk and extremities.  Currently he is not using any medications.  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  Patient reports that when he had back surgery Dr. Otelia Sergeant reported uric acid crystals at the time of surgery I do not have those records to confirm.  He was placed on allopurinol 200 mg po daily by Dr. Otelia Sergeant about a week ago.  I will add colchicine 0.6 mg p.o. twice daily and then once the swelling goes down he can reduce it to 1 tablet p.o. daily.- Plan: Uric acid today.  High risk medication use - Patient had reaction to methotrexate in the past.  He had good response to Humira which she took for 1 year. Positive family history of multiple sclerosis-mother - Plan: CBC with Differential/Platelet, COMPLETE METABOLIC PANEL WITH GFR, Hepatitis B core antibody, IgM, Hepatitis B surface antigen, Hepatitis C antibody, HIV Antibody (routine testing w rflx), QuantiFERON-TB Gold Plus, Serum protein electrophoresis with reflex, IgG, IgA, IgM  Pain in both hands  - Plan: XR Hand 2 View Right, x-ray was consistent with erosive inflammatory arthritis.  Rheumatoid factor, Cyclic citrul peptide antibody, IgG, ANA  Chronic pain of both knees - Plan: XR KNEE 3 VIEW RIGHT, XR KNEE 3 VIEW LEFT.  Left knee joint showed severe end-stage osteoarthritis and right knee joint moderate to severe osteoarthritis with bilateral severe chondromalacia patella.  Pain in both feet - Plan: XR Foot 2 Views Right, XR Foot 2 Views Left.  The x-rays were consistent with inflammatory  arthritis.  History of humerus fracture - Left humeral neck fracture approximately 2 months ago.  He still has limited range of motion.  DDD (degenerative disc disease), cervical-based on his prior x-rays.  DDD (degenerative disc disease), lumbar - s/p fusion 2012.  He still has chronic lower back pain.  Pancytopenia (HCC)-I reviewed prior labs which showed low white cell count although the white cell count appears to be fluctuating.  Essential hypertension-his blood pressure is well controlled.  History of hyperlipidemia  History of cellulitis  Primary insomnia  History of diabetes mellitus, type II  Smoker - smokes cigar   Orders: Orders Placed This Encounter  Procedures  . XR Hand 2 View Right  . XR KNEE 3 VIEW RIGHT  . XR KNEE 3 VIEW LEFT  . XR Foot 2 Views Right  . XR Foot 2 Views Left  . CBC with Differential/Platelet  . COMPLETE METABOLIC PANEL WITH GFR  . Uric acid  . Rheumatoid factor  . Cyclic citrul peptide antibody, IgG  . ANA  . Hepatitis B core antibody, IgM  . Hepatitis B surface antigen  . Hepatitis C antibody  . HIV Antibody (routine testing w rflx)  . QuantiFERON-TB Gold Plus  . Serum protein electrophoresis with reflex  . IgG, IgA, IgM   Meds ordered this encounter  Medications  . colchicine 0.6 MG tablet    Sig: Take 1 tablet (0.6 mg total) by mouth 2 (two) times daily.    Dispense:  180 tablet    Refill:  0    Face-to-face time spent with patient  was 60 minutes. Greater than 50% of time was spent in counseling and coordination of care.  Follow-Up Instructions: Return for Psoriatic arthritis, psoriasis, osteoarthritis.   Pollyann Savoy, MD  Note - This record has been created using [employer].  Chart creation errors have been sought, but may not always  have been located. Such creation errors do not reflect on  the standard of medical care.

## 2018-04-27 ENCOUNTER — Telehealth: Payer: Self-pay | Admitting: Pharmacy Technician

## 2018-04-27 ENCOUNTER — Ambulatory Visit (INDEPENDENT_AMBULATORY_CARE_PROVIDER_SITE_OTHER): Payer: Self-pay

## 2018-04-27 ENCOUNTER — Encounter: Payer: Self-pay | Admitting: Rheumatology

## 2018-04-27 ENCOUNTER — Ambulatory Visit: Payer: Medicare Other | Admitting: Rheumatology

## 2018-04-27 VITALS — BP 125/76 | HR 119 | Resp 17 | Ht 67.0 in | Wt 249.0 lb

## 2018-04-27 DIAGNOSIS — D61818 Other pancytopenia: Secondary | ICD-10-CM

## 2018-04-27 DIAGNOSIS — L4050 Arthropathic psoriasis, unspecified: Secondary | ICD-10-CM | POA: Diagnosis not present

## 2018-04-27 DIAGNOSIS — M25561 Pain in right knee: Secondary | ICD-10-CM

## 2018-04-27 DIAGNOSIS — L409 Psoriasis, unspecified: Secondary | ICD-10-CM

## 2018-04-27 DIAGNOSIS — M1A0320 Idiopathic chronic gout, left wrist, without tophus (tophi): Secondary | ICD-10-CM

## 2018-04-27 DIAGNOSIS — Z8639 Personal history of other endocrine, nutritional and metabolic disease: Secondary | ICD-10-CM

## 2018-04-27 DIAGNOSIS — M79671 Pain in right foot: Secondary | ICD-10-CM

## 2018-04-27 DIAGNOSIS — Z79899 Other long term (current) drug therapy: Secondary | ICD-10-CM | POA: Diagnosis not present

## 2018-04-27 DIAGNOSIS — M25562 Pain in left knee: Secondary | ICD-10-CM

## 2018-04-27 DIAGNOSIS — M5136 Other intervertebral disc degeneration, lumbar region: Secondary | ICD-10-CM

## 2018-04-27 DIAGNOSIS — G8929 Other chronic pain: Secondary | ICD-10-CM

## 2018-04-27 DIAGNOSIS — Z872 Personal history of diseases of the skin and subcutaneous tissue: Secondary | ICD-10-CM

## 2018-04-27 DIAGNOSIS — F5101 Primary insomnia: Secondary | ICD-10-CM

## 2018-04-27 DIAGNOSIS — M79641 Pain in right hand: Secondary | ICD-10-CM

## 2018-04-27 DIAGNOSIS — F17200 Nicotine dependence, unspecified, uncomplicated: Secondary | ICD-10-CM

## 2018-04-27 DIAGNOSIS — M79672 Pain in left foot: Secondary | ICD-10-CM

## 2018-04-27 DIAGNOSIS — M79642 Pain in left hand: Secondary | ICD-10-CM

## 2018-04-27 DIAGNOSIS — I10 Essential (primary) hypertension: Secondary | ICD-10-CM

## 2018-04-27 DIAGNOSIS — Z8781 Personal history of (healed) traumatic fracture: Secondary | ICD-10-CM

## 2018-04-27 DIAGNOSIS — M5030 Other cervical disc degeneration, unspecified cervical region: Secondary | ICD-10-CM

## 2018-04-27 MED ORDER — COLCHICINE 0.6 MG PO TABS: 0.6000 mg | ORAL_TABLET | Freq: Two times a day (BID) | ORAL | 0 refills | Status: AC

## 2018-04-27 NOTE — Progress Notes (Signed)
Pharmacy Note  Subjective:  Patient presents today to the [facility] to see Dr. Corliss Skains.  Patient was seen by the pharmacist for counseling on Cosentyx.  Objective:  CBC    Component Value Date/Time   WBC 10.5 (H) 11/18/2016 1026   WBC 3.8 (L) 10/28/2016 0521   RBC 3.70 (L) 11/18/2016 1026   RBC 3.08 (L) 10/28/2016 0521   HGB 12.6 (L) 11/18/2016 1026   HCT 35.7 (L) 11/18/2016 1026   PLT 266 11/18/2016 1026   MCV 97 11/18/2016 1026   MCH 34.1 (H) 11/18/2016 1026   MCH 33.8 10/28/2016 0521   MCHC 35.3 11/18/2016 1026   MCHC 35.5 10/28/2016 0521   RDW 13.8 11/18/2016 1026   LYMPHSABS 3.1 11/18/2016 1026   MONOABS 0.1 10/27/2016 0025   EOSABS 0.0 11/18/2016 1026   BASOSABS 0.1 11/18/2016 1026    CMP     Component Value Date/Time   NA 139 11/18/2016 1026   K 4.6 11/18/2016 1026   CL 101 10/28/2016 0521   CO2 19 (L) 11/18/2016 1026   GLUCOSE 229 (H) 11/18/2016 1026   BUN 16.0 11/18/2016 1026   CREATININE 1.2 11/18/2016 1026   CALCIUM 9.7 11/18/2016 1026   PROT 7.9 11/18/2016 1026   ALBUMIN 3.8 11/18/2016 1026   AST 40 (H) 11/18/2016 1026   ALT 25 11/18/2016 1026   ALKPHOS 82 11/18/2016 1026   BILITOT 0.28 11/18/2016 1026   GFRNONAA >60 10/28/2016 0521   GFRAA >60 10/28/2016 0521    TB Gold: pending 04/27/18 Hepatitis panel: pending 04/27/18 HIV: pending 04/27/18 SPEP: pending 04/27/18 Immunoglobulin: pending 04/27/18  Chest Xray: 2012 no active disease  Does patient have a history of inflammatory bowel disease? No  Assessment/Plan:  Counseled patient that Cosentyx is a IL-17 inhibitor that works to reduce pain and inflammation associated with arthritis.  Counseled patient on purpose, proper use, and adverse effects of Cosentyx. Reviewed the most common adverse effects of infection, inflammatory bowel disease, and allergic reaction.  Reviewed the importance of regular labs while on Cosentyx.  Counseled patient that Cosentyx should be held prior to  scheduled surgery.  Counseled patient to avoid live vaccines while on Cosentyx.  Advised patient to get annual influenza vaccine and the pneumococcal vaccine as indicated.  Provided patient with medication education material and answered all questions.  Patient consented to Cosentyx.  Will upload consent into patient's chart.  Will apply for Cosentyx through patient's insurance.  Reviewed storage information for Cosentyx.  Advised initial injection must be administered in office.  Patient voiced understanding.    Verlin Fester, PharmD, BCACP Rheumatology Clinical Pharmacist  04/27/2018 10:20 AM

## 2018-04-27 NOTE — Patient Instructions (Addendum)
Gout Gout is painful swelling that can happen in some of your joints. Gout is a type of arthritis. This condition is caused by having too much uric acid in your body. Uric acid is a chemical that is made when your body breaks down substances called purines. If your body has too much uric acid, sharp crystals can form and build up in your joints. This causes pain and swelling. Gout attacks can happen quickly and be very painful (acute gout). Over time, the attack Secukinumab injection What is this medicine? SECUKINUMAB (sek ue KIN ue mab) is used to treat psoriasis. It is also used to treat psoriatic arthritis and ankylosing spondylitis. This medicine may be used for other purposes; ask your health care provider or pharmacist if you have questions. COMMON BRAND NAME(S): Cosentyx What should I tell my health care provider before I take this medicine? They need to know if you have any of these conditions: -Crohn's disease, ulcerative colitis, or other inflammatory bowel disease -infection or history of infection -other conditions affecting the immune system -recently received or are scheduled to receive a vaccine -tuberculosis, a positive skin test for tuberculosis, or have recently been in close contact with someone who has tuberculosis -an unusual or allergic reaction to secukinumab, other medicines, latex, rubber, foods, dyes, or preservatives -pregnant or trying to get pregnant -breast-feeding How should I use this medicine? This medicine is for injection under the skin. It may be administered by a healthcare professional in a hospital or clinic setting or at home. If you get this medicine at home, you will be taught how to prepare and give this medicine. Use exactly as directed. Take your medicine at regular intervals. Do not take your medicine more often than directed. It is important that you put your used needles and syringes in a special sharps container. Do not put them in a trash can. If  you do not have a sharps container, call your pharmacist or healthcare provider to get one. A special MedGuide will be given to you by the pharmacist with each prescription and refill. Be sure to read this information carefully each time. Talk to your pediatrician regarding the use of this medicine in children. Special care may be needed. Overdosage: If you think you have taken too much of this medicine contact a poison control center or emergency room at once. NOTE: This medicine is only for you. Do not share this medicine with others. What if I miss a dose? It is important not to miss your dose. Call your doctor of health care professional if you are unable to keep an appointment. If you give yourself the medicine and you miss a dose, take it as soon as you can. If it is almost time for your next dose, take only that dose. Do not take double or extra doses. What may interact with this medicine? Do not take this medicine with any of the following medications: -live virus vaccines This medicine may also interact with the following medications: -cyclosporine -inactivated vaccines -warfarin This list may not describe all possible interactions. Give your health care provider a list of all the medicines, herbs, non-prescription drugs, or dietary supplements you use. Also tell them if you smoke, drink alcohol, or use illegal drugs. Some items may interact with your medicine. What should I watch for while using this medicine? Tell your doctor or healthcare professional if your symptoms do not start to get better or if they get worse. You will be tested for tuberculosis (  TB) before you start this medicine. If your doctor prescribes any medicine for TB, you should start taking the TB medicine before starting this medicine. Make sure to finish the full course of TB medicine. Call your doctor or healthcare professional for advice if you get a fever, chills or sore throat, or other symptoms of a cold or flu.  Do not treat yourself. This drug decreases your body's ability to fight infections. Try to avoid being around people who are sick. This medicine can decrease the response to a vaccine. If you need to get vaccinated, tell your healthcare professional if you have received this medicine within the last 6 months. Extra booster doses may be needed. Talk to your doctor to see if a different vaccination schedule is needed. What side effects may I notice from receiving this medicine? Side effects that you should report to your doctor or health care professional as soon as possible: -allergic reactions like skin rash, itching or hives, swelling of the face, lips, or tongue -signs and symptoms of infection like fever or chills; cough; sore throat; pain or trouble passing urine Side effects that usually do not require medical attention (report to your doctor or health care professional if they continue or are bothersome): -diarrhea This list may not describe all possible side effects. Call your doctor for medical advice about side effects. You may report side effects to FDA at 1-800-FDA-1088. Where should I keep my medicine? Keep out of the reach of children. Store the prefilled syringe or injection pen in a refrigerator between 2 to 8 degrees C (36 to 46 degrees F). Keep the syringe or the pen in the original carton until ready for use. Protect from light. Do not freeze. Do not shake. Prior to use, remove the syringe or pen from the refrigerator and use within 1 hour. Throw away any unused medicine after the expiration date on the label. NOTE: This sheet is a summary. It may not cover all possible information. If you have questions about this medicine, talk to your doctor, pharmacist, or health care provider.  2018 Elsevier/Gold Standard (2015-08-08 11:48:31) s can affect more joints and happen more often (chronic gout). Follow these instructions at home: During a Gout Attack  If directed, put ice on the  painful area: ? Put ice in a plastic bag. ? Place a towel between your skin and the bag. ? Leave the ice on for 20 minutes, 2-3 times a day.  Rest the joint as much as possible. If the joint is in your leg, you may be given crutches to use.  Raise (elevate) the painful joint above the level of your heart as often as you can.  Drink enough fluids to keep your pee (urine) clear or pale yellow.  Take over-the-counter and prescription medicines only as told by your doctor.  Do not drive or use heavy machinery while taking prescription pain medicine.  Follow instructions from your doctor about what you can or cannot eat and drink.  Return to your normal activities as told by your doctor. Ask your doctor what activities are safe for you. Avoiding Future Gout Attacks  Follow a low-purine diet as told by a specialist (dietitian) or your doctor. Avoid foods and drinks that have a lot of purines, such as: ? Liver. ? Kidney. ? Anchovies. ? Asparagus. ? Herring. ? Mushrooms ? Mussels. ? Beer.  Limit alcohol intake to no more than 1 drink a day for nonpregnant women and 2 drinks a day for  men. One drink equals 12 oz of beer, 5 oz of wine, or 1 oz of hard liquor.  Stay at a healthy weight or lose weight if you are overweight. If you want to lose weight, talk with your doctor. It is important that you do not lose weight too fast.  Start or continue an exercise plan as told by your doctor.  Drink enough fluids to keep your pee clear or pale yellow.  Take over-the-counter and prescription medicines only as told by your doctor.  Keep all follow-up visits as told by your doctor. This is important. Contact a doctor if:  You have another gout attack.  You still have symptoms of a gout attack after10 days of treatment.  You have problems (side effects) because of your medicines.  You have chills or a fever.  You have burning pain when you pee (urinate).  You have pain in your lower  back or belly. Get help right away if:  You have very bad pain.  Your pain cannot be controlled.  You cannot pee. This information is not intended to replace advice given to you by your health care provider. Make sure you discuss any questions you have with your health care provider. Document Released: 04/14/2008 Document Revised: 12/12/2015 Document Reviewed: 04/18/2015 Elsevier Interactive Patient Education  2018 [employer].  Vaccines You are taking a medication(s) that can suppress your immune system.  The following immunizations are recommended:  . Flu annually . Pneumonia . Shingrix . Hepatitis B  Please check with your PCP to make sure you are up to date.

## 2018-04-27 NOTE — Telephone Encounter (Signed)
Rph Amber discussed high copays with patient and patient assistance program for Medicare patients. Patient took home [employer] patient assistance application for Cosentyx. Will follow up.  11:09 AM Dorthula Nettles, CPhT

## 2018-04-27 NOTE — Telephone Encounter (Signed)
Received a fax from [employer] regarding a prior authorization for Cosentyx 300mg . Authorization has been APPROVED from 04/27/18 to 07/20/19.   Will send document to scan center.  Authorization # [id number] Phone # [phone]  Findings of benefits investigation via test claims:   Starter Doses- $2,718.00 Maintenance dose- $1540.66  11:07 AM Dorthula Nettles, CPhT

## 2018-04-27 NOTE — Telephone Encounter (Signed)
Received a Prior Authorization request from [employer] for Cosentyx 300mg . Authorization has been submitted to patient's insurance via Cover My Meds. Will update once we receive a response.  10:02 AM  Dorthula Nettles, CPhT

## 2018-04-29 LAB — COMPLETE METABOLIC PANEL WITH GFR
AG Ratio: 1.3 (calc) (ref 1.0–2.5)
ALBUMIN MSPROF: 4.2 g/dL (ref 3.6–5.1)
ALT: 10 U/L (ref 9–46)
AST: 13 U/L (ref 10–35)
Alkaline phosphatase (APISO): 93 U/L (ref 40–115)
BILIRUBIN TOTAL: 0.2 mg/dL (ref 0.2–1.2)
BUN: 11 mg/dL (ref 7–25)
CALCIUM: 9.7 mg/dL (ref 8.6–10.3)
CO2: 21 mmol/L (ref 20–32)
CREATININE: 0.91 mg/dL (ref 0.70–1.25)
Chloride: 103 mmol/L (ref 98–110)
GFR, EST AFRICAN AMERICAN: 99 mL/min/{1.73_m2} (ref >=60)
GFR, EST NON AFRICAN AMERICAN: 86 mL/min/{1.73_m2} (ref >=60)
GLUCOSE: 113 mg/dL — AB (ref 65–99)
Globulin: 3.2 g/dL (calc) (ref 1.9–3.7)
Potassium: 4.5 mmol/L (ref 3.5–5.3)
Sodium: 136 mmol/L (ref 135–146)
TOTAL PROTEIN: 7.4 g/dL (ref 6.1–8.1)

## 2018-04-29 LAB — CYCLIC CITRUL PEPTIDE ANTIBODY, IGG

## 2018-04-29 LAB — CBC WITH DIFFERENTIAL/PLATELET
BASOS PCT: 0.5 %
Basophils Absolute: 48 cells/uL (ref 0–200)
EOS ABS: 58 {cells}/uL (ref 15–500)
EOS PCT: 0.6 %
HCT: 40.3 % (ref 38.5–50.0)
HEMOGLOBIN: 13.7 g/dL (ref 13.2–17.1)
Lymphs Abs: 2813 cells/uL (ref 850–3900)
MCH: 30.1 pg (ref 27.0–33.0)
MCHC: 34 g/dL (ref 32.0–36.0)
MCV: 88.6 fL (ref 80.0–100.0)
MONOS PCT: 6.2 %
MPV: 9.5 fL (ref 7.5–12.5)
NEUTROS ABS: 6086 {cells}/uL (ref 1500–7800)
Neutrophils Relative %: 63.4 %
Platelets: 364 10*3/uL (ref 140–400)
RBC: 4.55 10*6/uL (ref 4.20–5.80)
RDW: 13.5 % (ref 11.0–15.0)
Total Lymphocyte: 29.3 %
WBC mixed population: 595 cells/uL (ref 200–950)
WBC: 9.6 10*3/uL (ref 3.8–10.8)

## 2018-04-29 LAB — PROTEIN ELECTROPHORESIS, SERUM, WITH REFLEX
ALBUMIN ELP: 3.9 g/dL (ref 3.8–4.8)
ALPHA 1: 0.4 g/dL — AB (ref 0.2–0.3)
Alpha 2: 1.2 g/dL — ABNORMAL HIGH (ref 0.5–0.9)
BETA 2: 0.7 g/dL — AB (ref 0.2–0.5)
BETA GLOBULIN: 0.5 g/dL (ref 0.4–0.6)
Gamma Globulin: 1.1 g/dL (ref 0.8–1.7)
TOTAL PROTEIN: 7.8 g/dL (ref 6.1–8.1)

## 2018-04-29 LAB — HEPATITIS C ANTIBODY
Hepatitis C Ab: NONREACTIVE
SIGNAL TO CUT-OFF: 0.04 (ref <1.00)

## 2018-04-29 LAB — QUANTIFERON-TB GOLD PLUS
Mitogen-NIL: >10 IU/mL
NIL: 0.03 [IU]/mL
QuantiFERON-TB Gold Plus: NEGATIVE
TB1-NIL: 0 IU/mL
TB2-NIL: 0 IU/mL

## 2018-04-29 LAB — IGG, IGA, IGM
IGG (IMMUNOGLOBIN G), SERUM: 1054 mg/dL (ref 600–1540)
IgM, Serum: 55 mg/dL (ref 50–300)
Immunoglobulin A: 542 mg/dL — ABNORMAL HIGH (ref 20–320)

## 2018-04-29 LAB — HEPATITIS B CORE ANTIBODY, IGM: HEP B C IGM: NONREACTIVE

## 2018-04-29 LAB — HIV ANTIBODY (ROUTINE TESTING W REFLEX): HIV: NONREACTIVE

## 2018-04-29 LAB — RHEUMATOID FACTOR: Rhuematoid fact SerPl-aCnc: <14 IU/mL (ref <14)

## 2018-04-29 LAB — HEPATITIS B SURFACE ANTIGEN: Hepatitis B Surface Ag: NONREACTIVE

## 2018-04-29 LAB — ANA: Anti Nuclear Antibody(ANA): NEGATIVE

## 2018-04-29 LAB — URIC ACID: Uric Acid, Serum: 4.3 mg/dL (ref 4.0–8.0)

## 2018-05-05 ENCOUNTER — Telehealth: Payer: Self-pay | Admitting: Pharmacist

## 2018-05-05 DIAGNOSIS — L4050 Arthropathic psoriasis, unspecified: Secondary | ICD-10-CM

## 2018-05-05 MED ORDER — SECUKINUMAB 150 MG/ML ~~LOC~~ SOAJ: 300.0000 mg | SUBCUTANEOUS | 0 refills | Status: AC

## 2018-05-05 NOTE — Telephone Encounter (Signed)
Patient brought in application to office, will fax to [employer]. Advised patient we wil contact him to schedule nurse new start visit.  3:01 PM Dorthula Nettles, CPhT

## 2018-05-05 NOTE — Telephone Encounter (Signed)
Patient brought back patient assistance forms with proof of income.  Will fax to [employer] and send to scan center.  Will up date patient when we receive a response.  Will schedule new start visit once approved.

## 2018-05-05 NOTE — Telephone Encounter (Signed)
Called patient to check status of patient assistance application for Cosentyx, he will bring to office this afternoon.  9:56 AM Dorthula Nettles, CPhT

## 2018-05-09 NOTE — Telephone Encounter (Signed)
Patient's application is in process could take up to 2 business days.

## 2018-05-09 NOTE — Telephone Encounter (Signed)
Refaxed Patient's income information to [employer]. Awaiting response.  11:17 AM Dorthula Nettles, CPhT

## 2018-05-10 NOTE — Progress Notes (Signed)
Office Visit Note  Patient: Casey Gibson             Date of Birth: 1945-12-04           MRN: [id number]             PCP: [name], MD Referring: [name], MD Visit Date: 05/18/2018 Occupation: @GUAROCC @  Subjective:  Pain in joints and psoriasis.   History of Present Illness: Casey Gibson is a 72 y.o. male with history of psoriatic arthritis and psoriasis.  He continues to have pain and swelling in his bilateral hands and feet.  He has been also having flare of psoriasis with rash on his trunk and extremities.  He denies any gout flare.  He also have end-stage osteoarthritis in his knee joint which causes discomfort.  Continues to have some neck discomfort.  Lower back pain persist.  Activities of Daily Living:  Patient reports morning stiffness for 1   hour.   Patient Denies nocturnal pain.  Difficulty dressing/grooming: Reports Difficulty climbing stairs: Reports Difficulty getting out of chair: Reports Difficulty using hands for taps, buttons, cutlery, and/or writing: Reports  Review of Systems  Constitutional: Positive for fatigue. Negative for night sweats.  HENT: Positive for mouth dryness. Negative for mouth sores and nose dryness.   Eyes: Negative for redness and dryness.  Respiratory: Negative for shortness of breath and difficulty breathing.   Cardiovascular: Negative for chest pain, palpitations, hypertension, irregular heartbeat and swelling in legs/feet.  Gastrointestinal: Negative for blood in stool, constipation and diarrhea.  Endocrine: Negative for increased urination.  Musculoskeletal: Positive for arthralgias, joint pain, joint swelling and morning stiffness. Negative for myalgias, muscle weakness, muscle tenderness and myalgias.  Skin: Positive for rash. Negative for color change, hair loss, nodules/bumps, skin tightness, ulcers and sensitivity to sunlight.       Psoriasis  Allergic/Immunologic: Negative for susceptible to infections.    Neurological: Negative for dizziness, fainting, memory loss, night sweats and weakness ( ).  Hematological: Negative for swollen glands.  Psychiatric/Behavioral: Positive for sleep disturbance. Negative for depressed mood. The patient is not nervous/anxious.     PMFS History:  Patient Active Problem List   Diagnosis Date Noted  . Psoriatic arthritis (HCC) 05/18/2018  . 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 High risk medication use 05/18/2018  . Primary osteoarthritis of both knees 05/18/2018  . DDD (degenerative disc disease), cervical 05/18/2018  . Thrombocytopenia (HCC) 10/28/2016  . Lactic acidosis   . Alcohol use   . Metabolic acidosis, increased anion gap (IAG)   . Psoriasis   . Pressure injury of skin 10/24/2016  . Pancytopenia (HCC)   . Thrush   . Cellulitis 10/23/2016  . Hyperglycemia   . Superinfection   . AKI (acute kidney injury) (HCC)   . DDD (degenerative disc disease), lumbar 06/26/2010  . SLEEP APNEA, OBSTRUCTIVE 12/06/2009  . INSOMNIA-SLEEP DISORDER-UNSPEC 11/19/2009  . HYPERSOMNIA 11/19/2009  . PERIPHERAL EDEMA 11/19/2009  . KNEE PAIN, BILATERAL 05/18/2008  . LEG PAIN, BILATERAL 05/18/2008  . DIABETES MELLITUS, TYPE II 06/06/2007  . HYPERLIPIDEMIA 06/06/2007  . HYPERTENSION 06/06/2007  . OSTEOARTHROSIS UNSPEC WHETHER GEN/LOC ANK&FOOT 06/06/2007    Past Medical History:  Diagnosis Date  . Diabetes mellitus without complication (HCC)   . Hypertension   . Psoriasis     Family History  Problem Relation Age of Onset  . Multiple sclerosis Mother   . Hypertension Mother   . Diabetes Mother   .  Heart Problems Father   . Migraines Son    Past Surgical History:  Procedure Laterality Date  . BACK SURGERY     Social History   Social History Narrative  . Not on file    Objective: Vital Signs: BP (!) 141/77 (BP Location: Right Arm, Patient Position: Sitting, Cuff Size: Large)   Pulse 98   Resp 16   Ht 5\' 7"  (1.702 m)    Wt 253 lb (114.8 kg)   BMI 39.63 kg/m    Physical Exam  Constitutional: He is oriented to person, place, and time. He appears well-developed and well-nourished.  HENT:  Head: Normocephalic and atraumatic.  Eyes: Pupils are equal, round, and reactive to light. Conjunctivae and EOM are normal.  Neck: Normal range of motion. Neck supple.  Cardiovascular: Normal rate, regular rhythm and normal heart sounds.  Pulmonary/Chest: Effort normal and breath sounds normal.  Abdominal: Soft. Bowel sounds are normal.  Neurological: He is alert and oriented to person, place, and time.  Skin: Skin is warm and dry. Capillary refill takes less than 2 seconds.  Extensive psoriasis on trunk and extremities  Psychiatric: He has a normal mood and affect. His behavior is normal.  Nursing note and vitals reviewed.    Musculoskeletal Exam: C-spine thoracic lumbar spine limited range of motion.  He walks with the help of a cane.  Left shoulder joint abduction is limited to only 30 degrees due to prior injury.  Elbow joints were in good range of motion.  He has limited range of motion of bilateral wrist joints with synovitis over his left wrist joint.  Synovitis was also noted over left hand PIP and MCP joints.  There was tenderness over right hand and PIP joints with no synovitis.  He has warmth and swelling in his left knee joint.  Swelling over bilateral ankle joints was noted.  He had tenderness over SI joints.  CDAI Exam: CDAI Score: Not documented Patient Global Assessment: Not documented; Provider Global Assessment: Not documented Swollen: Not documented; Tender: Not documented Joint Exam   Not documented   There is currently no information documented on the homunculus. Go to the Rheumatology activity and complete the homunculus joint exam.  Investigation: No additional findings.  Imaging: Xr Foot 2 Views Left  Result Date: 04/27/2018 Juxta-articular osteopenia was noted.  First, second third and  fourth MTPs, all PIP and DIP narrowing was noted.  All PIP and DIP narrowing was noted.  Dorsal spurring was noted.  Calcaneal spur was noted.  Calcification of posterior tibial artery was noted.  Subtalar joint space narrowing was noted. Impression: These findings are consistent with inflammatory arthritis and osteoarthritis overlap most likely psoriatic arthritis.  Xr Foot 2 Views Right  Result Date: 04/27/2018 Juxta-articular osteopenia was noted.  First, second third and fourth MTPs, all PIP and DIP narrowing was noted.  All PIP and DIP narrowing was noted.  Dorsal spurring was noted.  Calcaneal spur was noted.  Calcification of posterior tibial artery was noted. Impression: These findings are consistent with inflammatory arthritis and osteoarthritis overlap most likely psoriatic arthritis.  Xr Hand 2 View Right  Result Date: 04/27/2018 First second and third MCP narrowing was noted.  PIP narrowing was noted.  DIP narrowing was noted.  A plate was noted in the fourth metacarpal.  Callus was noted in the fifth metacarpal. Intercarpal and radiocarpal joint space narrowing was noted.  Erosive changes were noted in the carpal bones. Impression: These findings are consistent with erosive inflammatory  arthritis most likely psoriatic arthritis.   Xr Knee 3 View Left  Result Date: 04/27/2018 End-stage osteoarthritis of the left knee joint with severe medial compartment narrowing medial lateral osteophytes were noted.  Severe patellofemoral narrowing was noted. 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  Xr Knee 3 View Right  Result Date: 04/27/2018 Moderate to severe lateral compartment narrowing with lateral and intercondylar osteophytes was noted.  Severe patellofemoral narrowing was noted.  Calcification popliteal artery was noted. Impression: These findings are consistent with moderate to severe osteoarthritis of the knee joint and severe  chondromalacia patella.   Recent Labs: Lab Results  Component Value Date   WBC 9.6 04/27/2018   HGB 13.7 04/27/2018   PLT 364 04/27/2018   NA 136 04/27/2018   K 4.5 04/27/2018   CL 103 04/27/2018   CO2 21 04/27/2018   GLUCOSE 113 (H) 04/27/2018   BUN 11 04/27/2018   CREATININE 0.91 04/27/2018   BILITOT 0.2 04/27/2018   ALKPHOS 82 11/18/2016   AST 13 04/27/2018   ALT 10 04/27/2018   PROT 7.4 04/27/2018   PROT 7.8 04/27/2018   ALBUMIN 3.8 11/18/2016   CALCIUM 9.7 04/27/2018   GFRAA 99 04/27/2018   QFTBGOLDPLUS NEGATIVE 04/27/2018    Speciality Comments: No specialty comments available.  Procedures:  No procedures performed Allergies: Simvastatin; Celecoxib; Sulfonamide derivatives; Methotrexate derivatives; and Penicillins   Assessment / Plan:     Visit Diagnoses: Psoriatic arthritis (HCC) - 03/17/18 CT of left wrist: Advanced erosive arthropathy,erosive changes in hand and feet XRs.  Patient with aggressive arthritis with synovitis involving multiple joints.  We had detailed discussion regarding Cosentyx last visit.  Medication has been approved.  He will get his first Cosentyx injection today.  We will be starting him on 300 mg of Cosentyx loading dose and then monthly dose after that.  He will get labs in 1 month and then every 3 months to monitor for drug toxicity.  Side effects were again reviewed.  Psoriasis - Extensive on trunk and extremities.  High risk medication use - Tx with Humira for 1 yr, Fmhx of MS (mother)-avoid Anti-TNFs. Rxn to MTX in the past.  Patient will start on Cosentyx today.  He will be observed in the office for 30 minutes after the injection.  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 Otelia Sergeant started pt on Allopurinol (uric acid crystals found at time of back surgery per pt), started on Colchicine 0.6 mg po BID.  His uric acid is in desirable range.  Primary osteoarthritis of both knees - Left: severe end-stage OARight: moderate to  severe bilateral severe chondromalacia patella  History of humerus fracture - Left humeral neck fracture approximately 2 months ago.  He has limited range of motion in his left shoulder.  DDD (degenerative disc disease), cervical-neck pain.  DDD (degenerative disc disease), lumbar - - s/p fusion 2012.  He continues to have discomfort in his lumbar region.  Essential hypertension-blood pressure is mildly elevated.  Have advised him to monitor his blood pressure.  Other medical problems are listed as follows:  History of hyperlipidemia  History of cellulitis  Primary insomnia  History of diabetes mellitus, type II  Smoker -smoking cessation was discussed.  Orders: Orders Placed This Encounter  Procedures  . CBC with Differential/Platelet  . COMPLETE METABOLIC PANEL WITH GFR   Meds ordered this encounter  Medications  . Secukinumab SOAJ 300 mg    Face-to-face time spent with  patient was 30 minutes. Greater than 50% of time was spent in counseling and coordination of care.  Follow-Up Instructions: Return in about 3 months (around 08/18/2018) for Psoriatic arthritis, psoriasis, osteoarthritis, gout.   Pollyann Savoy, MD  Note - This record has been created using Animal nutritionist.  Chart creation errors have been sought, but may not always  have been located. Such creation errors do not reflect on  the standard of medical care.

## 2018-05-11 NOTE — Telephone Encounter (Signed)
Received fax from [employer], Patient Assistance application has been APPROVED. Coverage is from 05/11/18 to 07/19/18. Patient's eligibility will be redetermined at the end of the year for 2020.  Will send document to scan Center.  Phone# [phone] Fax# [phone]  12:24 PM Dorthula Nettles, CPhT

## 2018-05-11 NOTE — Telephone Encounter (Signed)
Called patient and advised of approval. Let him know that [employer] will be calling in the next couple days to schedule shipment. And that we will call him back to schedule new start visit. Patient had no questions or concerns at this time.  2:05 PM Dorthula Nettles, CPhT

## 2018-05-12 NOTE — Telephone Encounter (Signed)
Notified patient that he was approved for Cosentyx patient assistance program.  Scheduled new start visit for Tuesday, October 29 at 9 AM.  Informed patient that he may be receiving a call from [employer] in order to set up shipment for his medication.  We will follow-up about medication shipment on appointment Tuesday.

## 2018-05-16 ENCOUNTER — Telehealth: Payer: Self-pay | Admitting: Pharmacy Technician

## 2018-05-16 NOTE — Progress Notes (Deleted)
Pharmacy Note  Subjective:  Patient presents today to the [facility] to see Dr. Corliss Skains.  Patient was seen by the pharmacist for counseling on Cosentyx for psoriatic arthritis.  Objective:  CBC    Component Value Date/Time   WBC 9.6 04/27/2018 0903   RBC 4.55 04/27/2018 0903   HGB 13.7 04/27/2018 0903   HGB 12.6 (L) 11/18/2016 1026   HCT 40.3 04/27/2018 0903   HCT 35.7 (L) 11/18/2016 1026   PLT 364 04/27/2018 0903   PLT 266 11/18/2016 1026   MCV 88.6 04/27/2018 0903   MCV 97 11/18/2016 1026   MCH 30.1 04/27/2018 0903   MCHC 34.0 04/27/2018 0903   RDW 13.5 04/27/2018 0903   RDW 13.8 11/18/2016 1026   LYMPHSABS 2,813 04/27/2018 0903   LYMPHSABS 3.1 11/18/2016 1026   MONOABS 0.1 10/27/2016 0025   EOSABS 58 04/27/2018 0903   EOSABS 0.0 11/18/2016 1026   BASOSABS 48 04/27/2018 0903   BASOSABS 0.1 11/18/2016 1026    CMP     Component Value Date/Time   NA 136 04/27/2018 0903   NA 139 11/18/2016 1026   K 4.5 04/27/2018 0903   K 4.6 11/18/2016 1026   CL 103 04/27/2018 0903   CO2 21 04/27/2018 0903   CO2 19 (L) 11/18/2016 1026   GLUCOSE 113 (H) 04/27/2018 0903   GLUCOSE 229 (H) 11/18/2016 1026   BUN 11 04/27/2018 0903   BUN 16.0 11/18/2016 1026   CREATININE 0.91 04/27/2018 0903   CREATININE 1.2 11/18/2016 1026   CALCIUM 9.7 04/27/2018 0903   CALCIUM 9.7 11/18/2016 1026   PROT 7.4 04/27/2018 0903   PROT 7.8 04/27/2018 0903   PROT 7.9 11/18/2016 1026   ALBUMIN 3.8 11/18/2016 1026   AST 13 04/27/2018 0903   AST 40 (H) 11/18/2016 1026   ALT 10 04/27/2018 0903   ALT 25 11/18/2016 1026   ALKPHOS 82 11/18/2016 1026   BILITOT 0.2 04/27/2018 0903   BILITOT 0.28 11/18/2016 1026   GFRNONAA 86 04/27/2018 0903   GFRAA 99 04/27/2018 0903   Quantiferon TB Gold Latest Ref Rng & Units 04/27/2018  Quantiferon TB Gold Plus NEGATIVE NEGATIVE    Hepatitis Latest Ref Rng & Units 04/27/2018  Hep B Surface Ag NON-REACTI NON-REACTIVE  Hep B IgM NON-REACTI  NON-REACTIVE  Hep C Ab 0.0 - 0.9 s/co ratio -  Hep C Ab NON-REACTI NON-REACTIVE  Hep C Ab NON-REACTI NON-REACTIVE  Hep A IgM Negative -    Lab Results  Component Value Date   HIV NON-REACTIVE 04/27/2018   HIV Non Reactive 10/27/2016    Immunoglobulin Electrophoresis Latest Ref Rng & Units 04/27/2018  IgA  20 - 320 mg/dL 454(U)  IgG 981 - 1,914 mg/dL 7,829  IgM 50 - 562 mg/dL 55    Serum Protein Electrophoresis Latest Ref Rng & Units 04/27/2018  Total Protein 6.1 - 8.1 g/dL 7.8  Albumin 3.8 - 4.8 g/dL 3.9  Alpha-1 0.2 - 0.3 g/dL 1.3(Y)  Alpha-2 0.5 - 0.9 g/dL 8.6(V)  Beta Globulin 0.4 - 0.6 g/dL 0.5  Beta 2 0.2 - 0.5 g/dL 7.8(I)  Gamma Globulin 0.8 - 1.7 g/dL 1.1   Chest x-ray: Stable left lung scarring changes.  No new focal or worrisome acute abnormalities. 08/21/10  Does patient have a history of inflammatory bowel disease? {yes/no:20286}  Assessment/Plan:  Counseled patient that Cosentyx is a IL-17 inhibitor that works to reduce pain and inflammation associated with arthritis.  Counseled patient on purpose, proper use, and adverse effects  of Cosentyx. Reviewed the most common adverse effects of infection, inflammatory bowel disease, and allergic reaction.  Reviewed the importance of regular labs while on Cosentyx.  Counseled patient that Cosentyx should be held prior to scheduled surgery.  Counseled patient to avoid live vaccines while on Cosentyx.  Advised patient to get annual influenza vaccine and the pneumococcal vaccine as indicated. Provided patient with medication education material and answered all questions.   Demonstrated proper injection technique with Cosentyx demo pen.  Patient able to demonstrate proper injection technique using the teach back method.  Patient self injected in the **** with:  Sample Medication: Cosentyx  NDC: *** Lot: *** Expiration: **  Patient tolerated well.  Observed for 30 mins in office for adverse reaction and ***. Instructed patient to  call with any questions/issues.

## 2018-05-16 NOTE — Telephone Encounter (Signed)
Patient called to say he received shipment from [employer]. Advised patient to store in fridge. Noticed patient ha new start scheduled for 10/29 and NPT f/u appointment for 10/30. Amber Rph said new start can be combined with NPT f/u on 05/18/18 @ 9:45am. Confirmed with patient.  4:09 PM Dorthula Nettles, CPhT

## 2018-05-17 ENCOUNTER — Ambulatory Visit: Payer: Self-pay

## 2018-05-17 NOTE — Progress Notes (Signed)
Pharmacy Note  Subjective:  Patient presents today to the [facility] to see Dr. Corliss Skains.  Patient was seen by the pharmacist for counseling on Cosentyx for psoriatic arthritis.  Objective:  CBC    Component Value Date/Time   WBC 9.6 04/27/2018 0903   RBC 4.55 04/27/2018 0903   HGB 13.7 04/27/2018 0903   HGB 12.6 (L) 11/18/2016 1026   HCT 40.3 04/27/2018 0903   HCT 35.7 (L) 11/18/2016 1026   PLT 364 04/27/2018 0903   PLT 266 11/18/2016 1026   MCV 88.6 04/27/2018 0903   MCV 97 11/18/2016 1026   MCH 30.1 04/27/2018 0903   MCHC 34.0 04/27/2018 0903   RDW 13.5 04/27/2018 0903   RDW 13.8 11/18/2016 1026   LYMPHSABS 2,813 04/27/2018 0903   LYMPHSABS 3.1 11/18/2016 1026   MONOABS 0.1 10/27/2016 0025   EOSABS 58 04/27/2018 0903   EOSABS 0.0 11/18/2016 1026   BASOSABS 48 04/27/2018 0903   BASOSABS 0.1 11/18/2016 1026    CMP     Component Value Date/Time   NA 136 04/27/2018 0903   NA 139 11/18/2016 1026   K 4.5 04/27/2018 0903   K 4.6 11/18/2016 1026   CL 103 04/27/2018 0903   CO2 21 04/27/2018 0903   CO2 19 (L) 11/18/2016 1026   GLUCOSE 113 (H) 04/27/2018 0903   GLUCOSE 229 (H) 11/18/2016 1026   BUN 11 04/27/2018 0903   BUN 16.0 11/18/2016 1026   CREATININE 0.91 04/27/2018 0903   CREATININE 1.2 11/18/2016 1026   CALCIUM 9.7 04/27/2018 0903   CALCIUM 9.7 11/18/2016 1026   PROT 7.4 04/27/2018 0903   PROT 7.8 04/27/2018 0903   PROT 7.9 11/18/2016 1026   ALBUMIN 3.8 11/18/2016 1026   AST 13 04/27/2018 0903   AST 40 (H) 11/18/2016 1026   ALT 10 04/27/2018 0903   ALT 25 11/18/2016 1026   ALKPHOS 82 11/18/2016 1026   BILITOT 0.2 04/27/2018 0903   BILITOT 0.28 11/18/2016 1026   GFRNONAA 86 04/27/2018 0903   GFRAA 99 04/27/2018 0903   Quantiferon TB Gold Latest Ref Rng & Units 04/27/2018  Quantiferon TB Gold Plus NEGATIVE NEGATIVE    Hepatitis Latest Ref Rng & Units 04/27/2018  Hep B Surface Ag NON-REACTI NON-REACTIVE  Hep B IgM NON-REACTI  NON-REACTIVE  Hep C Ab 0.0 - 0.9 s/co ratio -  Hep C Ab NON-REACTI NON-REACTIVE  Hep C Ab NON-REACTI NON-REACTIVE  Hep A IgM Negative -    Lab Results  Component Value Date   HIV NON-REACTIVE 04/27/2018   HIV Non Reactive 10/27/2016    Immunoglobulin Electrophoresis Latest Ref Rng & Units 04/27/2018  IgA  20 - 320 mg/dL 161(W)  IgG 960 - 4,540 mg/dL 9,811  IgM 50 - 914 mg/dL 55    Serum Protein Electrophoresis Latest Ref Rng & Units 04/27/2018  Total Protein 6.1 - 8.1 g/dL 7.8  Albumin 3.8 - 4.8 g/dL 3.9  Alpha-1 0.2 - 0.3 g/dL 7.8(G)  Alpha-2 0.5 - 0.9 g/dL 9.5(A)  Beta Globulin 0.4 - 0.6 g/dL 0.5  Beta 2 0.2 - 0.5 g/dL 2.1(H)  Gamma Globulin 0.8 - 1.7 g/dL 1.1   Chest x-ray: Stable left lung scarring changes.  08/21/2010   Immunization History  Administered Date(s) Administered  . Influenza Whole 06/06/2007, 05/18/2008, 05/27/2009  . Pneumococcal Polysaccharide-23 05/27/2009  . Td 05/27/2009    Does patient have a history of inflammatory bowel disease? No  Assessment/Plan:  Counseled patient that Cosentyx is a IL-17 inhibitor that  works to reduce pain and inflammation associated with arthritis.  Counseled patient on purpose, proper use, and adverse effects of Cosentyx. Reviewed the most common adverse effects of infection, inflammatory bowel disease, and allergic reaction.  Reviewed the importance of regular labs while on Cosentyx. Instructed patient to come back in 1 month and then every 3 months.  Standing orders in place. Counseled patient that Cosentyx should be held prior to scheduled surgery.  Counseled patient to avoid live vaccines while on Cosentyx.  Recommend flu, Pneumovax 23, Prevnar 13, and Shingrix as indicated.  Provided patient with medication education material and answered all questions.   Reviewed storage information for Cosentyx.   Advised initial injection must be administered in office.  Patient voiced understanding.    Demonstrated proper injection  technique with Cosentyx demo pen.  Patient able to demonstrate proper injection technique using the teach back method. Patient self injected in the lower right abdomen with:  Sample Medication: Cosentyx 150mg /mL 2 Sensoready Pens (300 mg) NDC: 1610-9604-54 Lot: [id number] Expiration: 08/2019   Patient tolerated well.  Observed for 30 mins in office for adverse reaction and none noted.   Patient was also set up for medication shipment through [employer] Patient Assistance Program by [facility] Pharm[facility] Advocate Rachael.  Instructed patient to call with any questions/issues.    Verlin Fester, PharmD, BCACP Rheumatology Clinical Pharmacist  05/18/2018 11:20 AM

## 2018-05-18 ENCOUNTER — Ambulatory Visit: Payer: Medicare Other | Admitting: Rheumatology

## 2018-05-18 ENCOUNTER — Encounter: Payer: Self-pay | Admitting: Rheumatology

## 2018-05-18 VITALS — BP 141/77 | HR 98 | Resp 16 | Ht 67.0 in | Wt 253.0 lb

## 2018-05-18 DIAGNOSIS — F17200 Nicotine dependence, unspecified, uncomplicated: Secondary | ICD-10-CM

## 2018-05-18 DIAGNOSIS — L409 Psoriasis, unspecified: Secondary | ICD-10-CM

## 2018-05-18 DIAGNOSIS — Z8639 Personal history of other endocrine, nutritional and metabolic disease: Secondary | ICD-10-CM

## 2018-05-18 DIAGNOSIS — M5030 Other cervical disc degeneration, unspecified cervical region: Secondary | ICD-10-CM | POA: Insufficient documentation

## 2018-05-18 DIAGNOSIS — Z872 Personal history of diseases of the skin and subcutaneous tissue: Secondary | ICD-10-CM

## 2018-05-18 DIAGNOSIS — M1A0320 Idiopathic chronic gout, left wrist, without tophus (tophi): Secondary | ICD-10-CM | POA: Diagnosis not present

## 2018-05-18 DIAGNOSIS — Z79899 Other long term (current) drug therapy: Secondary | ICD-10-CM | POA: Diagnosis not present

## 2018-05-18 DIAGNOSIS — L4050 Arthropathic psoriasis, unspecified: Secondary | ICD-10-CM | POA: Diagnosis not present

## 2018-05-18 DIAGNOSIS — M170 Bilateral primary osteoarthritis of knee: Secondary | ICD-10-CM

## 2018-05-18 DIAGNOSIS — I10 Essential (primary) hypertension: Secondary | ICD-10-CM

## 2018-05-18 DIAGNOSIS — M5136 Other intervertebral disc degeneration, lumbar region: Secondary | ICD-10-CM

## 2018-05-18 DIAGNOSIS — Z8781 Personal history of (healed) traumatic fracture: Secondary | ICD-10-CM

## 2018-05-18 DIAGNOSIS — F5101 Primary insomnia: Secondary | ICD-10-CM

## 2018-05-18 MED ORDER — SECUKINUMAB 150 MG/ML ~~LOC~~ SOAJ
300.0000 mg | Freq: Once | SUBCUTANEOUS | Status: AC
Start: 2018-05-18 — End: 2018-05-18
  Administered 2018-05-18: 300 mg via SUBCUTANEOUS

## 2018-05-18 NOTE — Patient Instructions (Addendum)
Cosentyx Dosing 2 pens weekly for the next 4 weeks (11/6, 11/13, 11/20, 11/27), then 2 pens every 4 weeks (12/25,1/22. Etc.)  Standing Labs We placed an order today for your standing lab work.    Please come back and get your standing labs in 1 month and then every 3 months.  We have open lab Monday through Friday from 8:30-11:30 AM and 1:30-4:00 PM  at the office of Dr. Pollyann Savoy.   You may experience shorter wait times on Monday and Friday afternoons. The office is located at [address], [place], [place] [address] No appointment is necessary.   Labs are drawn by [facility].  You may receive a bill from [facility] for your lab work. If you have any questions regarding directions or hours of operation,  please call [phone].   Just as a reminder please drink plenty of water prior to coming for your lab work. Thanks!  Vaccines You are taking a medication(s) that can suppress your immune system.  The following immunizations are recommended: . Flu annually . Pneumonia (Pneumovax 23 and Prevnar 13 after age 50) . Shingrix . Hepatitis B  Please check with your PCP to make sure you are up to date.

## 2018-05-18 NOTE — Telephone Encounter (Signed)
Patient's initial shipment from [employer] is scheduled for 05/19/18.

## 2018-05-18 NOTE — Telephone Encounter (Signed)
Patient came in for NPT F/U appointment and shipment he received was just a starter kit. No medication was included. [employer], rep advised they left a message for patient on Friday. But initial shipment is still waiting on patient to confirm. Will have patient call in during appointment today.  10:58 AM Beatriz Chancellor, CPhT

## 2018-05-24 ENCOUNTER — Ambulatory Visit: Payer: Medicare Other | Admitting: Rheumatology

## 2018-06-01 ENCOUNTER — Ambulatory Visit (INDEPENDENT_AMBULATORY_CARE_PROVIDER_SITE_OTHER): Payer: Medicare Other | Admitting: Specialist

## 2018-06-07 ENCOUNTER — Encounter (INDEPENDENT_AMBULATORY_CARE_PROVIDER_SITE_OTHER): Payer: Self-pay | Admitting: Specialist

## 2018-06-13 ENCOUNTER — Telehealth: Payer: Self-pay | Admitting: Pharmacy Technician

## 2018-06-13 NOTE — Telephone Encounter (Signed)
  Patient came by to sign renewal application. Will fax in once MD signature have been received.

## 2018-06-13 NOTE — Telephone Encounter (Signed)
Spoke to patient about renewing patient assistance for 2020. He will stop by office today to sign application.  9:34 AM Dorthula Nettlesachael N Ralyn Stlaurent, CPhT

## 2018-06-14 ENCOUNTER — Telehealth: Payer: Self-pay | Admitting: Rheumatology

## 2018-06-14 NOTE — Telephone Encounter (Signed)
Working on re-faxing document. Thanks!

## 2018-06-14 NOTE — Telephone Encounter (Signed)
Tim from [employer] left a voicemail stating they did receive the healthcare provider application for patient's re-enrollment for Cosentyx, unfortunately the fax machine cut off half way through so the prescription is invalid because we cannot see the signature or date.  Please fax again #[phone]  If you have any questions, please call [phone]

## 2018-06-24 NOTE — Telephone Encounter (Signed)
Called [employer] to check status of patient's renewal application, it is in review and all documents have been received.  Phone# [phone]  9:56 AM Casey Gibson, CPhT

## 2018-07-30 ENCOUNTER — Emergency Department (HOSPITAL_COMMUNITY)
Admission: EM | Admit: 2018-07-30 | Discharge: 2018-08-20 | Disposition: E | Payer: Medicare Other | Attending: Emergency Medicine | Admitting: Emergency Medicine

## 2018-07-30 ENCOUNTER — Encounter (HOSPITAL_COMMUNITY): Payer: Self-pay | Admitting: Pharmacy Technician

## 2018-07-30 DIAGNOSIS — I10 Essential (primary) hypertension: Secondary | ICD-10-CM | POA: Insufficient documentation

## 2018-07-30 DIAGNOSIS — I469 Cardiac arrest, cause unspecified: Secondary | ICD-10-CM | POA: Insufficient documentation

## 2018-07-30 DIAGNOSIS — E119 Type 2 diabetes mellitus without complications: Secondary | ICD-10-CM | POA: Diagnosis not present

## 2018-07-30 MED ORDER — EPINEPHRINE PF 1 MG/10ML IJ SOSY
PREFILLED_SYRINGE | INTRAMUSCULAR | Status: AC | PRN
  Administered 2018-07-30: 1 mg via INTRAVENOUS

## 2018-07-30 MED ORDER — CALCIUM CHLORIDE 10 % IV SOLN
INTRAVENOUS | Status: AC | PRN
  Administered 2018-07-30: 1 g via INTRAVENOUS

## 2018-07-30 MED ORDER — SODIUM BICARBONATE 8.4 % IV SOLN
INTRAVENOUS | Status: AC | PRN
  Administered 2018-07-30: 25 meq via INTRAVENOUS

## 2018-07-31 MED FILL — Medication: Qty: 1 | Status: AC

## 2018-08-01 NOTE — Telephone Encounter (Signed)
Advised [employer] that patient has passed away

## 2018-08-19 ENCOUNTER — Ambulatory Visit: Payer: Medicare Other | Admitting: Rheumatology

## 2018-08-20 NOTE — ED Notes (Signed)
Blood sent to main lab 

## 2018-08-20 NOTE — ED Provider Notes (Signed)
[facility] EMERGENCY DEPARTMENT Provider Note   CSN: [id number] Arrival date & time: 03/27/2019  1754     History   Chief Complaint No chief complaint on file.   HPI Casey Gibson is a 73 y.o. male.  HPI   Patient is a 73 year old male with a past medical history of diabetes and hypertension who presents via EMS for further evaluation management of cardiac arrest.  Patient was reportedly last seen normal approximately 2 hours ago and was found to be in cardiac arrest on EMS arrival.  Per EMS patient was initially in V. fib fib and received approximately 4 defibrillations, magnesium, amiodarone, and 6 mg of epinephrine with ROSC and subsequent loss of pulses in route.  In route a King airway was placed.  Patient is unable to write any history as he is unresponsive.  No further history is available from EMS.  Past Medical History:  Diagnosis Date  . Diabetes mellitus without complication (HCC)   . Hypertension   . Psoriasis     Patient Active Problem List   Diagnosis Date Noted  . Psoriatic arthritis (HCC) 05/18/2018  . 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 High risk medication use 05/18/2018  . Primary osteoarthritis of both knees 05/18/2018  . DDD (degenerative disc disease), cervical 05/18/2018  . Thrombocytopenia (HCC) 10/28/2016  . Lactic acidosis   . Alcohol use   . Metabolic acidosis, increased anion gap (IAG)   . Psoriasis   . Pressure injury of skin 10/24/2016  . Pancytopenia (HCC)   . Thrush   . Cellulitis 10/23/2016  . Hyperglycemia   . Superinfection   . AKI (acute kidney injury) (HCC)   . DDD (degenerative disc disease), lumbar 06/26/2010  . SLEEP APNEA, OBSTRUCTIVE 12/06/2009  . INSOMNIA-SLEEP DISORDER-UNSPEC 11/19/2009  . HYPERSOMNIA 11/19/2009  . PERIPHERAL EDEMA 11/19/2009  . KNEE PAIN, BILATERAL 05/18/2008  . LEG PAIN, BILATERAL 05/18/2008  . DIABETES MELLITUS, TYPE II 06/06/2007  . HYPERLIPIDEMIA  06/06/2007  . HYPERTENSION 06/06/2007  . OSTEOARTHROSIS UNSPEC WHETHER GEN/LOC ANK&FOOT 06/06/2007    Past Surgical History:  Procedure Laterality Date  . BACK SURGERY          Home Medications    Prior to Admission medications   Medication Sig Start Date End Date Taking? Authorizing Provider  allopurinol (ZYLOPRIM) 100 MG tablet Take 1 tablet (100 mg total) by mouth 2 (two) times daily. 04/13/18   Kerrin ChampagneNitka, James E, MD  amLODipine (NORVASC) 10 MG tablet Take 10 mg by mouth daily. 07/31/16   [provider]  aspirin EC 81 MG tablet Take 81 mg by mouth daily.    [provider]  benazepril-hydrochlorthiazide (LOTENSIN HCT) 20-12.5 MG tablet Take 1 tablet by mouth daily.    [provider]  colchicine 0.6 MG tablet Take 1 tablet (0.6 mg total) by mouth 2 (two) times daily. 04/27/18   Pollyann Savoyeveshwar, Shaili, MD  diclofenac sodium (VOLTAREN) 1 % GEL Apply 4 g topically 4 (four) times daily. 04/13/18   Kerrin ChampagneNitka, James E, MD  docusate sodium (COLACE) 100 MG capsule Take 1 capsule (100 mg total) by mouth 2 (two) times daily. Patient not taking: Reported on 05/18/2018 10/28/16   Richarda OverlieAbrol, Nayana, MD  glipiZIDE (GLUCOTROL) 10 MG tablet Take 10 mg by mouth 2 (two) times daily. 09/04/16   [provider]  metFORMIN (GLUCOPHAGE) 1000 MG tablet Take 1,000 mg by mouth 2 (two) times daily. 09/22/16   [provider]  oxyCODONE-acetaminophen (  PERCOCET) 7.5-325 MG tablet Take 1 tablet by mouth 3 (three) times daily as needed for severe pain. 01/21/18   Elpidio Anis, PA-C  Secukinumab (COSENTYX SENSOREADY, 300 MG,) 150 MG/ML SOAJ Inject 300 mg into the skin once a week. Loading 0,1,2,3,4. Please deliver first shipment to the clinic. 05/05/18   Pollyann Savoy, MD  traMADol (ULTRAM) 50 MG tablet Take 1 tablet (50 mg total) by mouth every 8 (eight) hours as needed. Patient not taking: Reported on 04/27/2018 01/27/18   Naida Sleight, PA-C    Family History Family History    Problem Relation Age of Onset  . Multiple sclerosis Mother   . Hypertension Mother   . Diabetes Mother   . Heart Problems Father   . Migraines Son     Social History Social History   Tobacco Use  . Smoking status: Current Every Day Smoker    Years: 30.00    Types: Cigars  . Smokeless tobacco: Never Used  Substance Use Topics  . Alcohol use: Yes    Comment: occ  . Drug use: No     Allergies   Simvastatin; Celecoxib; Sulfonamide derivatives; Methotrexate derivatives; and Penicillins   Review of Systems Review of Systems  Unable to perform ROS: Patient unresponsive     Physical Exam Updated Vital Signs There were no vitals taken for this visit.  Physical Exam Vitals signs and nursing note reviewed.  HENT:     Right Ear: External ear normal.     Left Ear: External ear normal.  Cardiovascular:     Pulses:          Carotid pulses are 0 on the right side and 0 on the left side.      Radial pulses are 0 on the right side and 0 on the left side.       Femoral pulses are 0 on the right side and 0 on the left side. Pulmonary:     Effort: Respiratory distress present.  Abdominal:     General: Abdomen is flat. There is no distension.  Neurological:     Mental Status: He is unresponsive.      ED Treatments / Results  Labs (all labs ordered are listed, but only abnormal results are displayed) Labs Reviewed - No data to display  EKG None  Radiology No results found.  Procedures Procedures (including critical care time)  Medications Ordered in ED Medications  EPINEPHrine (ADRENALIN) 1 MG/10ML injection (1 mg Intravenous Given 08/08/18 1753)  calcium chloride injection (1 g Intravenous Given 2018/08/08 1755)  sodium bicarbonate injection (25 mEq Intravenous Given August 08, 2018 1755)     Initial Impression / Assessment and Plan / ED Course  I have reviewed the triage vital signs and the nursing notes.  Pertinent labs & imaging results that were available during  my care of the patient were reviewed by me and considered in my medical decision making (see chart for details).     Patient is a 29-year-old male who presents with above-stated history exam.  On presentation patient has a King airway in place and he was ventilated with a BVM.  On arrival he was found to be pulseless and CPR was initiated.  Patient received 1 mg of epinephrine, 1 mg of calcium chloride, and bicarb.  At our first pulse check the patient was noted to be in V. tach and received a defibrillation.  He received another round of CPR and on the next pulse check he was noted to be pulseless  and asystole on the monitor.  At this time further resuscitative efforts were halted as it was felt that they would be in futility.  Time of death was called at 1755.  Patient's PCP will sign death certificate.  Final Clinical Impressions(s) / ED Diagnoses   Final diagnoses:  Cardiac arrest [facility](HCC)    ED Discharge Orders    None       Antoine PrimasSmith, , MD 2018-09-16 1911    Gerhard MunchLockwood, Robert, MD 2018-09-16 [id number]

## 2018-08-20 NOTE — ED Triage Notes (Signed)
Pt bib ems CPR in progress. Pt last seen approx 2 hours prior to EMS arrival. 8 epi, amiodarone, magnesium given en route. Pt shocked multiple times with EMS. EMS reports family had said pt had been complaining of trouble breathing due to "allegies" lately.

## 2018-08-20 NOTE — Progress Notes (Signed)
   Aug 08, 2018 1812  Clinical Encounter Type  Visited With Patient and family together  Visit Type Initial  Referral From Nurse  Consult/Referral To Chaplain  The chaplain responded to ED page to Trauma A for EOL.  The Pt. family was emotional while gathering in Consult B and waiting room.  With the help of security, the chaplain was spiritually present with the family as the family talked to the medical team and were spiritually present with the deceased Pt.

## 2018-08-20 NOTE — Code Documentation (Signed)
CPR remains in progress.  

## 2018-08-20 NOTE — Code Documentation (Addendum)
Unable to palpate pulse, CPR resumed.

## 2018-08-20 NOTE — Code Documentation (Signed)
Asystole on the monitor. TOD called 1755.

## 2018-08-20 NOTE — Code Documentation (Signed)
Pulse Check, PEA. CPR resumed.

## 2018-08-20 DEATH — deceased

## 2019-02-07 IMAGING — CR DG FOREARM 2V*L*
2 series · 2 of 2 positions shown · non-contrast
Comparison: None.

CLINICAL DATA: Fell downstairs with extremity pain

EXAM:
LEFT FOREARM - 2 VIEW

[forearm ap]
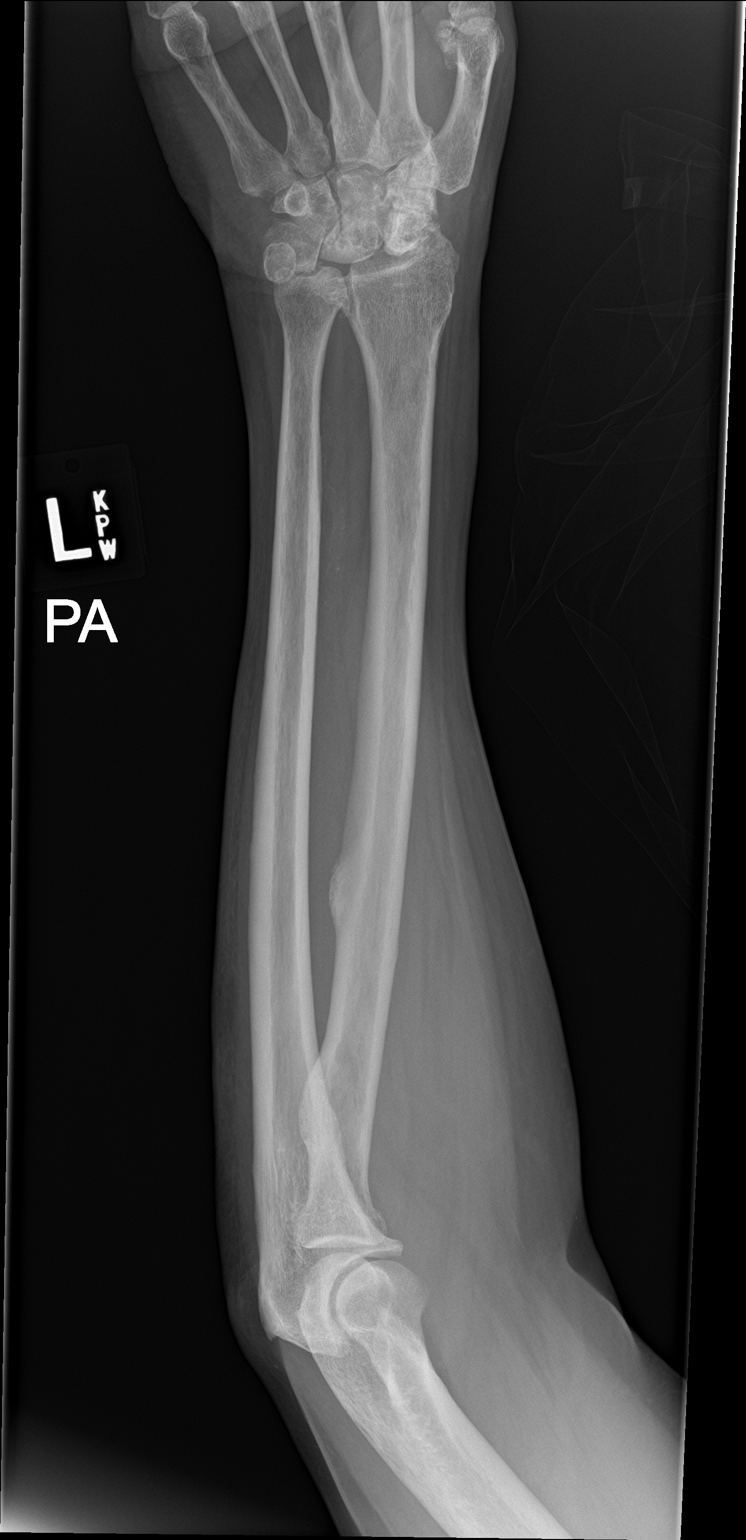

[forearm lat]
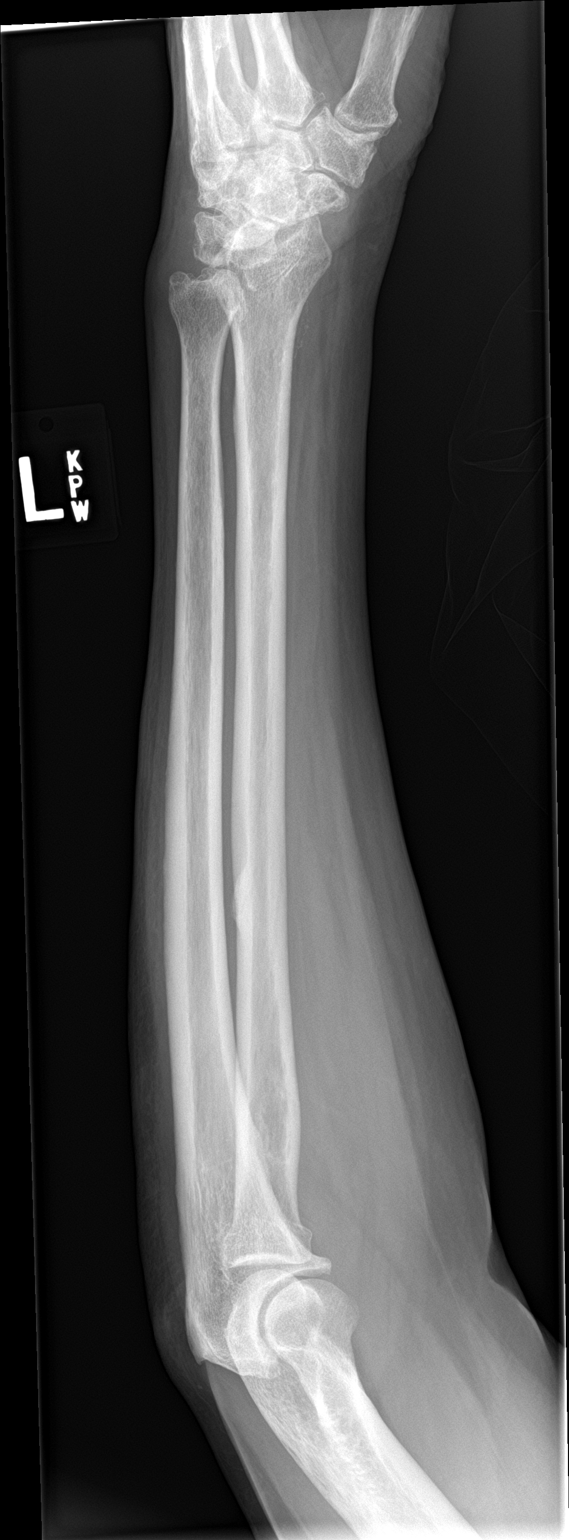

[2 of 2 positions shown; findings below may reference images not displayed]

FINDINGS: No acute displaced fracture or malalignment. Soft tissues are
unremarkable.
IMPRESSION: No acute osseous abnormality.
